# Patient Record
Sex: Male | Born: 1980
Health system: Southern US, Community
[De-identification: ages and names within clinical notes are randomized; demographics above are authoritative.]

## PROBLEM LIST (undated history)

## (undated) DIAGNOSIS — N5089 Other specified disorders of the male genital organs: Secondary | ICD-10-CM

## (undated) DIAGNOSIS — C6290 Malignant neoplasm of unspecified testis, unspecified whether descended or undescended: Secondary | ICD-10-CM

## (undated) DIAGNOSIS — B019 Varicella without complication: Secondary | ICD-10-CM

## (undated) DIAGNOSIS — K219 Gastro-esophageal reflux disease without esophagitis: Secondary | ICD-10-CM

## (undated) HISTORY — DX: Gastro-esophageal reflux disease without esophagitis: K21.9

## (undated) HISTORY — DX: Malignant neoplasm of unspecified testis, unspecified whether descended or undescended: C62.90

## (undated) HISTORY — DX: Varicella without complication: B01.9

---

## 2016-11-20 ENCOUNTER — Other Ambulatory Visit: Payer: Self-pay | Admitting: Urology

## 2016-11-21 ENCOUNTER — Encounter (HOSPITAL_BASED_OUTPATIENT_CLINIC_OR_DEPARTMENT_OTHER): Payer: Self-pay | Admitting: *Deleted

## 2016-11-21 NOTE — Progress Notes (Signed)
SPOKE W/ PT WIFE.  PT VERY ANXIOUS ABOUT DX.  NPO AFTER MN.  ARRIVE AT 8325.  NEEDS HG. NOTE , IT'S PT'S BIRTHDAY DOS.

## 2016-11-24 ENCOUNTER — Ambulatory Visit (HOSPITAL_BASED_OUTPATIENT_CLINIC_OR_DEPARTMENT_OTHER): Payer: Managed Care, Other (non HMO) | Admitting: Anesthesiology

## 2016-11-24 ENCOUNTER — Encounter (HOSPITAL_BASED_OUTPATIENT_CLINIC_OR_DEPARTMENT_OTHER)
Admission: RE | Disposition: A | Discharge status: Home / Self Care | Payer: Self-pay | Source: Ambulatory Visit | Attending: Urology

## 2016-11-24 ENCOUNTER — Encounter (HOSPITAL_BASED_OUTPATIENT_CLINIC_OR_DEPARTMENT_OTHER): Payer: Self-pay | Admitting: *Deleted

## 2016-11-24 ENCOUNTER — Ambulatory Visit (HOSPITAL_BASED_OUTPATIENT_CLINIC_OR_DEPARTMENT_OTHER)
Admission: RE | Admit: 2016-11-24 | Discharge: 2016-11-24 | Disposition: A | Discharge status: Home / Self Care | Payer: Managed Care, Other (non HMO) | Source: Ambulatory Visit | Attending: Urology | Admitting: Urology

## 2016-11-24 DIAGNOSIS — Z302 Encounter for sterilization: Secondary | ICD-10-CM | POA: Insufficient documentation

## 2016-11-24 DIAGNOSIS — N5089 Other specified disorders of the male genital organs: Secondary | ICD-10-CM | POA: Diagnosis present

## 2016-11-24 DIAGNOSIS — Z880 Allergy status to penicillin: Secondary | ICD-10-CM | POA: Diagnosis not present

## 2016-11-24 DIAGNOSIS — C6211 Malignant neoplasm of descended right testis: Secondary | ICD-10-CM | POA: Insufficient documentation

## 2016-11-24 HISTORY — DX: Other specified disorders of the male genital organs: N50.89

## 2016-11-24 HISTORY — PX: ORCHIECTOMY: SHX2116

## 2016-11-24 HISTORY — PX: VASECTOMY: SHX75

## 2016-11-24 LAB — POCT HEMOGLOBIN-HEMACUE: HEMOGLOBIN: 14.7 g/dL (ref 13.0–17.0)

## 2016-11-24 SURGERY — ORCHIECTOMY
Anesthesia: General | Laterality: Right

## 2016-11-24 MED ORDER — PROPOFOL 10 MG/ML IV BOLUS
INTRAVENOUS | Status: AC
  Filled 2016-11-24: qty 40

## 2016-11-24 MED ORDER — CEFAZOLIN SODIUM-DEXTROSE 2-4 GM/100ML-% IV SOLN
2.0000 g | INTRAVENOUS | Status: DC
  Filled 2016-11-24: qty 100

## 2016-11-24 MED ORDER — MIDAZOLAM HCL 5 MG/5ML IJ SOLN
INTRAMUSCULAR | Status: DC | PRN
  Administered 2016-11-24: 2 mg via INTRAVENOUS
  Administered 2016-11-24 (×2): 1 mg via INTRAVENOUS

## 2016-11-24 MED ORDER — BUPIVACAINE HCL (PF) 0.25 % IJ SOLN
INTRAMUSCULAR | Status: DC | PRN
  Administered 2016-11-24: 17 mL

## 2016-11-24 MED ORDER — HYDROMORPHONE HCL 1 MG/ML IJ SOLN
0.2500 mg | INTRAMUSCULAR | Status: DC | PRN
  Filled 2016-11-24: qty 0.5

## 2016-11-24 MED ORDER — CIPROFLOXACIN IN D5W 400 MG/200ML IV SOLN
INTRAVENOUS | Status: DC | PRN
  Administered 2016-11-24: 400 mg via INTRAVENOUS

## 2016-11-24 MED ORDER — ONDANSETRON HCL 4 MG/2ML IJ SOLN
INTRAMUSCULAR | Status: AC
  Filled 2016-11-24: qty 2

## 2016-11-24 MED ORDER — DEXAMETHASONE SODIUM PHOSPHATE 4 MG/ML IJ SOLN
INTRAMUSCULAR | Status: DC | PRN
  Administered 2016-11-24: 10 mg via INTRAVENOUS

## 2016-11-24 MED ORDER — LIDOCAINE HCL (CARDIAC) 20 MG/ML IV SOLN
INTRAVENOUS | Status: DC | PRN
  Administered 2016-11-24: 100 mg via INTRAVENOUS

## 2016-11-24 MED ORDER — DEXAMETHASONE SODIUM PHOSPHATE 10 MG/ML IJ SOLN
INTRAMUSCULAR | Status: AC
  Filled 2016-11-24: qty 1

## 2016-11-24 MED ORDER — ONDANSETRON HCL 4 MG/2ML IJ SOLN
INTRAMUSCULAR | Status: DC | PRN
  Administered 2016-11-24: 4 mg via INTRAVENOUS

## 2016-11-24 MED ORDER — SODIUM CHLORIDE 0.9 % IV SOLN
250.0000 mL | INTRAVENOUS | Status: DC | PRN
  Filled 2016-11-24: qty 250

## 2016-11-24 MED ORDER — MIDAZOLAM HCL 2 MG/2ML IJ SOLN
INTRAMUSCULAR | Status: AC
  Filled 2016-11-24: qty 4

## 2016-11-24 MED ORDER — FENTANYL CITRATE (PF) 100 MCG/2ML IJ SOLN
INTRAMUSCULAR | Status: DC | PRN
  Administered 2016-11-24: 25 ug via INTRAVENOUS
  Administered 2016-11-24: 50 ug via INTRAVENOUS
  Administered 2016-11-24: 25 ug via INTRAVENOUS
  Administered 2016-11-24: 50 ug via INTRAVENOUS
  Administered 2016-11-24 (×2): 25 ug via INTRAVENOUS

## 2016-11-24 MED ORDER — MEPERIDINE HCL 25 MG/ML IJ SOLN
6.2500 mg | INTRAMUSCULAR | Status: DC | PRN
  Filled 2016-11-24: qty 1

## 2016-11-24 MED ORDER — FENTANYL CITRATE (PF) 100 MCG/2ML IJ SOLN
INTRAMUSCULAR | Status: AC
  Filled 2016-11-24: qty 2

## 2016-11-24 MED ORDER — ACETAMINOPHEN 325 MG PO TABS
650.0000 mg | ORAL_TABLET | ORAL | Status: DC | PRN
  Filled 2016-11-24: qty 2

## 2016-11-24 MED ORDER — ACETAMINOPHEN 650 MG RE SUPP
650.0000 mg | RECTAL | Status: DC | PRN
  Filled 2016-11-24: qty 1

## 2016-11-24 MED ORDER — METOCLOPRAMIDE HCL 5 MG/ML IJ SOLN
INTRAMUSCULAR | Status: AC
  Filled 2016-11-24: qty 2

## 2016-11-24 MED ORDER — LIDOCAINE 2% (20 MG/ML) 5 ML SYRINGE
INTRAMUSCULAR | Status: AC
  Filled 2016-11-24: qty 5

## 2016-11-24 MED ORDER — LACTATED RINGERS IV SOLN
INTRAVENOUS | Status: DC
  Administered 2016-11-24 (×2): via INTRAVENOUS
  Filled 2016-11-24: qty 1000

## 2016-11-24 MED ORDER — PROPOFOL 10 MG/ML IV BOLUS
INTRAVENOUS | Status: DC | PRN
  Administered 2016-11-24: 200 mg via INTRAVENOUS
  Administered 2016-11-24: 50 mg via INTRAVENOUS

## 2016-11-24 MED ORDER — TRAMADOL HCL 50 MG PO TABS: 50.0000 mg | ORAL_TABLET | Freq: Four times a day (QID) | ORAL | 0 refills | Status: DC | PRN

## 2016-11-24 MED ORDER — SODIUM CHLORIDE 0.9% FLUSH
3.0000 mL | Freq: Two times a day (BID) | INTRAVENOUS | Status: DC
  Filled 2016-11-24: qty 3

## 2016-11-24 MED ORDER — OXYCODONE HCL 5 MG PO TABS
5.0000 mg | ORAL_TABLET | ORAL | Status: DC | PRN
  Filled 2016-11-24: qty 2

## 2016-11-24 MED ORDER — CIPROFLOXACIN IN D5W 400 MG/200ML IV SOLN
INTRAVENOUS | Status: AC
  Filled 2016-11-24: qty 200

## 2016-11-24 MED ORDER — KETOROLAC TROMETHAMINE 30 MG/ML IJ SOLN
30.0000 mg | Freq: Four times a day (QID) | INTRAMUSCULAR | Status: DC
  Filled 2016-11-24: qty 1

## 2016-11-24 MED ORDER — SODIUM CHLORIDE 0.9% FLUSH
3.0000 mL | INTRAVENOUS | Status: DC | PRN
  Filled 2016-11-24: qty 3

## 2016-11-24 MED ORDER — KETOROLAC TROMETHAMINE 30 MG/ML IJ SOLN
INTRAMUSCULAR | Status: DC | PRN
  Administered 2016-11-24: 30 mg via INTRAVENOUS

## 2016-11-24 MED ORDER — KETOROLAC TROMETHAMINE 30 MG/ML IJ SOLN
INTRAMUSCULAR | Status: AC
  Filled 2016-11-24: qty 1

## 2016-11-24 MED ORDER — METOCLOPRAMIDE HCL 5 MG/ML IJ SOLN
INTRAMUSCULAR | Status: DC | PRN
  Administered 2016-11-24: 10 mg via INTRAVENOUS

## 2016-11-24 MED ORDER — ONDANSETRON HCL 4 MG/2ML IJ SOLN
4.0000 mg | Freq: Once | INTRAMUSCULAR | Status: DC | PRN
Start: 2016-11-24 — End: 2016-11-24
  Filled 2016-11-24: qty 2

## 2016-11-24 SURGICAL SUPPLY — 45 items
BLADE CLIPPER SURG (BLADE) ×4 IMPLANT
BLADE SURG 15 STRL LF DISP TIS (BLADE) ×2 IMPLANT
BLADE SURG 15 STRL SS (BLADE) ×2
BNDG GAUZE ELAST 4 BULKY (GAUZE/BANDAGES/DRESSINGS) ×4 IMPLANT
CLOTH BEACON ORANGE TIMEOUT ST (SAFETY) ×4 IMPLANT
COVER BACK TABLE 60X90IN (DRAPES) ×4 IMPLANT
COVER MAYO STAND STRL (DRAPES) ×4 IMPLANT
DERMABOND ADVANCED (GAUZE/BANDAGES/DRESSINGS) ×2
DERMABOND ADVANCED .7 DNX12 (GAUZE/BANDAGES/DRESSINGS) ×2 IMPLANT
DISSECTOR ROUND CHERRY 3/8 STR (MISCELLANEOUS) IMPLANT
DRAPE LAPAROTOMY 100X72 PEDS (DRAPES) ×4 IMPLANT
DRSG TEGADERM 2-3/8X2-3/4 SM (GAUZE/BANDAGES/DRESSINGS) IMPLANT
ELECT NEEDLE TIP 2.8 STRL (NEEDLE) ×4 IMPLANT
ELECT REM PT RETURN 9FT ADLT (ELECTROSURGICAL) ×4
ELECTRODE REM PT RTRN 9FT ADLT (ELECTROSURGICAL) ×2 IMPLANT
GAUZE SPONGE 4X4 16PLY XRAY LF (GAUZE/BANDAGES/DRESSINGS) IMPLANT
GLOVE BIO SURGEON STRL SZ8 (GLOVE) ×4 IMPLANT
GOWN STRL REUS W/ TWL XL LVL3 (GOWN DISPOSABLE) ×2 IMPLANT
GOWN STRL REUS W/TWL XL LVL3 (GOWN DISPOSABLE) ×6 IMPLANT
GOWN W/2 COTTON TOWELS 2 STD (GOWNS) ×4 IMPLANT
KIT RM TURNOVER CYSTO AR (KITS) ×4 IMPLANT
MANIFOLD NEPTUNE II (INSTRUMENTS) IMPLANT
NEEDLE HYPO 25X1 1.5 SAFETY (NEEDLE) ×4 IMPLANT
NEEDLE HYPO 25X5/8 SAFETYGLIDE (NEEDLE) ×4 IMPLANT
NS IRRIG 500ML POUR BTL (IV SOLUTION) ×4 IMPLANT
PACK BASIN DAY SURGERY FS (CUSTOM PROCEDURE TRAY) ×4 IMPLANT
PENCIL BUTTON HOLSTER BLD 10FT (ELECTRODE) ×4 IMPLANT
SUPPORT SCROTAL LG STRP (MISCELLANEOUS) ×3 IMPLANT
SUPPORTER ATHLETIC LG (MISCELLANEOUS) ×1
SUT CHROMIC 3 0 SH 27 (SUTURE) ×4 IMPLANT
SUT CHROMIC 4 0 SH 27 (SUTURE) ×4 IMPLANT
SUT MNCRL AB 4-0 PS2 18 (SUTURE) ×4 IMPLANT
SUT SILK 0 TIES 10X30 (SUTURE) ×4 IMPLANT
SUT VIC AB 3-0 SH 27 (SUTURE) ×2
SUT VIC AB 3-0 SH 27X BRD (SUTURE) ×2 IMPLANT
SUT VICRYL 2 0 18  UND BR (SUTURE)
SUT VICRYL 2 0 18 UND BR (SUTURE) IMPLANT
SYR BULB IRRIGATION 50ML (SYRINGE) IMPLANT
SYR CONTROL 10ML LL (SYRINGE) ×4 IMPLANT
TOWEL OR 17X24 6PK STRL BLUE (TOWEL DISPOSABLE) ×8 IMPLANT
TRAY DSU PREP LF (CUSTOM PROCEDURE TRAY) ×4 IMPLANT
TUBE CONNECTING 12'X1/4 (SUCTIONS) ×1
TUBE CONNECTING 12X1/4 (SUCTIONS) ×3 IMPLANT
WATER STERILE IRR 500ML POUR (IV SOLUTION) ×4 IMPLANT
YANKAUER SUCT BULB TIP NO VENT (SUCTIONS) ×4 IMPLANT

## 2016-11-24 NOTE — Anesthesia Preprocedure Evaluation (Signed)
Anesthesia Evaluation  Patient identified by MRN, date of birth, ID band Patient awake    Reviewed: Allergy & Precautions, NPO status , Patient's Chart, lab work & pertinent test results  Airway Mallampati: I  TM Distance: >3 FB Neck ROM: Full    Dental   Pulmonary    Pulmonary exam normal        Cardiovascular Normal cardiovascular exam     Neuro/Psych    GI/Hepatic   Endo/Other    Renal/GU      Musculoskeletal   Abdominal   Peds  Hematology   Anesthesia Other Findings   Reproductive/Obstetrics                             Anesthesia Physical Anesthesia Plan  ASA: II  Anesthesia Plan: General   Post-op Pain Management:    Induction: Intravenous  Airway Management Planned: LMA  Additional Equipment:   Intra-op Plan:   Post-operative Plan: Extubation in OR  Informed Consent: I have reviewed the patients History and Physical, chart, labs and discussed the procedure including the risks, benefits and alternatives for the proposed anesthesia with the patient or authorized representative who has indicated his/her understanding and acceptance.     Plan Discussed with: CRNA and Surgeon  Anesthesia Plan Comments:         Anesthesia Quick Evaluation

## 2016-11-24 NOTE — Anesthesia Postprocedure Evaluation (Signed)
Anesthesia Post Note  Patient: Kvon Mcilhenny  Procedure(s) Performed: Procedure(s) (LRB): ORCHIECTOMY (Right) VASECTOMY (Left)  Patient location during evaluation: PACU Anesthesia Type: General Level of consciousness: awake and alert Pain management: pain level controlled Vital Signs Assessment: post-procedure vital signs reviewed and stable Respiratory status: spontaneous breathing, nonlabored ventilation, respiratory function stable and patient connected to nasal cannula oxygen Cardiovascular status: blood pressure returned to baseline and stable Postop Assessment: no signs of nausea or vomiting Anesthetic complications: no       Last Vitals:  Vitals:   11/24/16 1315 11/24/16 1400  BP: 115/72 (!) 107/56  Pulse: (!) 45 (!) 45  Resp: 12   Temp:  36.6 C    Last Pain:  Vitals:   11/24/16 1400  TempSrc:   PainSc: 0-No pain                 Mileigh Tilley DAVID

## 2016-11-24 NOTE — H&P (Signed)
  Urology History and Physical Exam  CC: Right testicular mass  HPI: 36 year old male presents for rt inguinal orchiectomy as well as a left vasectomy. He presented recently for his vasectomy but was noted to have a rt testicular mass. U/S confirmed a mixed echogenic mass throughout his rt testicle. Serum markers were essentially negative. He presents for rt inguinal orchiectomy and lt vasectomy.  PMH: Past Medical History:  Diagnosis Date  . Mass of right testis     PSH: Past Surgical History:  Procedure Laterality Date  . NO PAST SURGERIES      Allergies: Allergies  Allergen Reactions  . Penicillins Rash    Childhood allergy    Medications: No prescriptions prior to admission.     Social History: Social History   Social History  . Marital status: Married    Spouse name: N/A  . Number of children: N/A  . Years of education: N/A   Occupational History  . Not on file.   Social History Main Topics  . Smoking status: Never Smoker  . Smokeless tobacco: Former Systems developer    Types: Chew    Quit date: 11/21/2001  . Alcohol use Yes     Comment: social  . Drug use: No  . Sexual activity: Not on file   Other Topics Concern  . Not on file   Social History Narrative  . No narrative on file    Family History: History reviewed. No pertinent family history.  Review of Systems: Positive: Rt testicular swelling Negative: A further 10 point review of systems was negative except what is listed in the HPI.                  Physical Exam: @VITALS2 @ General: No acute distress.  Awake. Head:  Normocephalic.  Atraumatic. ENT:  EOMI.  Mucous membranes moist Neck:  Supple.  No lymphadenopathy. CV:  S1 present. S2 present. Regular rate. Pulmonary: Equal effort bilaterally.  Clear to auscultation bilaterally. Abdomen: Soft.  Non tender to palpation. Skin:  Normal turgor.  No visible rash. Extremity: No gross deformity of bilateral upper extremities.  No gross deformity of                              lower extremities. Neurologic: Alert. Appropriate mood.  Penis:             circumcised.  No lesions. Urethra: Orthotopic meatus. Scrotum: No lesions.  No ecchymosis.  No erythema. Testicles: Descended bilaterally.  Rt testicular mass. Epididymis: Palpable bilaterally. Nontender to palpation.  Studies:  No results for input(s): HGB, WBC, PLT in the last 72 hours.  No results for input(s): NA, K, CL, CO2, BUN, CREATININE, CALCIUM, GFRNONAA, GFRAA in the last 72 hours.  Invalid input(s): MAGNESIUM   No results for input(s): INR, APTT in the last 72 hours.  Invalid input(s): PT   Invalid input(s): ABG    Assessment:  Rt testicular mass, desire for vasectomy  Plan: Lt vasectomy, rt inguinal orchiectomy

## 2016-11-24 NOTE — Discharge Instructions (Signed)
°  Post Anesthesia Home Care Instructions ° °Activity: °Get plenty of rest for the remainder of the day. A responsible individual must stay with you for 24 hours following the procedure.  °For the next 24 hours, DO NOT: °-Drive a car °-Operate machinery °-Drink alcoholic beverages °-Take any medication unless instructed by your physician °-Make any legal decisions or sign important papers. ° °Meals: °Start with liquid foods such as gelatin or soup. Progress to regular foods as tolerated. Avoid greasy, spicy, heavy foods. If nausea and/or vomiting occur, drink only clear liquids until the nausea and/or vomiting subsides. Call your physician if vomiting continues. ° °Special Instructions/Symptoms: °Your throat may feel dry or sore from the anesthesia or the breathing tube placed in your throat during surgery. If this causes discomfort, gargle with warm salt water. The discomfort should disappear within 24 hours. ° °If you had a scopolamine patch placed behind your ear for the management of post- operative nausea and/or vomiting: ° °1. The medication in the patch is effective for 72 hours, after which it should be removed.  Wrap patch in a tissue and discard in the trash. Wash hands thoroughly with soap and water. °2. You may remove the patch earlier than 72 hours if you experience unpleasant side effects which may include dry mouth, dizziness or visual disturbances. °3. Avoid touching the patch. Wash your hands with soap and water after contact with the patch. °  ° HOME CARE INSTRUCTIONS FOR SCROTAL PROCEDURES ° °Wound Care & Hygiene: °You may apply an ice bag to the scrotum for the first 24 hours.  This may help decrease swelling and soreness.  You may have a dressing held in place by an athletic supporter.  You may remove the dressing in 24 hours and shower in 48 hours.  Continue to use the athletic supporter or tight briefs for at least a week. °Activity: °Rest today - not necessarily flat bed rest.  Just take it  easy.  You should not do strenuous activities until your follow-up visit with your doctor.  You may resume light activity in 48 hours. ° °Return to Work: ° °Your doctor will advise you of this depending on the type of work you do ° °Diet: °Drink liquids or eat a light diet this evening.  You may resume a regular diet tomorrow. ° °General Expectations: °You may have a small amount of bleeding.  The scrotum may be swollen or bruised for about a week. ° °Call your Doctor if these occur: ° -persistent or heavy bleeding ° -temperature of 101 degrees or more ° -severe pain, not relieved by your pain medication ° ° ° °

## 2016-11-24 NOTE — Op Note (Signed)
Preoperative diagnosis: Right testicular mass, probable testicular carcinoma, desire for sterility  Postoperative diagnosis: Same  Principal procedure: Right inguinal orchiectomy, left vasectomy  Surgeon: Kapil Petropoulos  Anesthesia: General with LMA, right spermatic cord block, Marcaine in the left vasal sheath  Complications: None  Estimated blood loss: Less than 5 cc  Specimen: Right testicle and cord, specimen of left vasa  Drains: None  Indications: 36 year old male recently presenting for vasectomy in the office.  He was found to have a right testicular mass.  This was verified with scrotal ultrasound.  There is a strong suspicion for testicular carcinoma.  The patient presents at this time for right inguinal orchiectomy as well as a left vasectomy.  He has been counseled on the surgery of orchiectomy, as well as risks and complications.  He previously has been counseled regarding vasectomy.  He accepts both of these and agrees to proceed.  Description of procedure: The patient was properly identified and marked in the holding area.  He received IV Cipro.  He was taken to the operating room where general anesthetic was administered with the LMA.  He was placed in dorsolithotomy position.  Genitalia and perineum as well as the inguinal region were prepped and draped.  Proper timeout was performed.  A 2-1/2 cm incision was created directly over the right external inguinal ring and carried down to subcutaneous fat with electrocautery.  Dissection was then carried out bluntly with the [employer] retractors.  The external inguinal ring and the spermatic cord were easily identified.  The spermatic cord was carefully dissected out, leaving the ilioinguinal nerve intact laterally.  The cord was then retracted superiorly, and the right testicle pushed from the scrotum up into the incision.  The testicle was delivered through the incision.  Gubernaculum was then divided with blunt dissection and with  electrocautery.  Dissection was then carried out superiorly on the cord to the external inguinal ring.  Cord was skeletonized.  The cord was then divided into 2 packets, with each packet clamped with a Kelly clamp approximately 1 cm distal to the external inguinal ring.  The spermatic cord was then divided just distal to the clamps.  Approximately 5-6 cc of quarter percent plain Marcaine was then used to establish a spermatic cord block.  Each cord packet was then ligated doubly with #1 silk ties.  Hemostasis was adequate.  The cord stump was then pushed up inside the external inguinal ring. Attention was then paid to the incision.  Subcutaneous tissue was hemostatic.  The scrotum was inverted, and the inner surface was examined.  Hemostasis was excellent. I then placed 5 cc of Marcaine in the incision along the skin edges.  Subcutaneous tissue was reapproximated using a running simple suture of 3-0 Vicryl.  Skin edges were then reapproximated using 4-0 Monocryl placed in a running subcuticular fashion.  Dermabond was then placed on the wound.  Attention was then paid to the left vas deferens, which was transfixedin the anterior scrotum underneath the median raphae with the vasectomy clamp. Proximal and distal extent of the vas deferens underneath was then infiltrated with 5 cc of quarter percent plain Marcaine.  Puncture was then made over top of the vas deferens with the vasectomy hemostat, and this small puncture was then widened.  The vas deferens was then grasped inside the puncture site, and brought through the wound.  The vas was then skeletonized proximally and distally for approximately 2 cm.  It was then clamped proximally and distally, and a segment  excised.  The stumps were then ligated with 2-0 Vicryl.  The ends were cauterized.  The ends were then folded and buried in dartos fascia separately with 4-0 Vicryl.  They were then delivered back into the wound.  The wound was then covered with  Dermabond.  Dry sterile dressings were then placed underneath a jockstrap.  The patient was then awakened, and taken to the PACU in stable condition.  He tolerated the procedure well.

## 2016-11-24 NOTE — Anesthesia Procedure Notes (Signed)
Procedure Name: LMA Insertion Date/Time: 11/24/2016 11:50 AM Performed by: Justice Rocher Pre-anesthesia Checklist: Patient identified, Emergency Drugs available, Suction available and Patient being monitored Patient Re-evaluated:Patient Re-evaluated prior to inductionOxygen Delivery Method: Circle system utilized Preoxygenation: Pre-oxygenation with 100% oxygen Intubation Type: IV induction Ventilation: Mask ventilation without difficulty LMA: LMA inserted LMA Size: 5.0 Number of attempts: 1 Airway Equipment and Method: Bite block Placement Confirmation: positive ETCO2 and breath sounds checked- equal and bilateral Tube secured with: Tape Dental Injury: Teeth and Oropharynx as per pre-operative assessment

## 2016-11-24 NOTE — Transfer of Care (Signed)
Immediate Anesthesia Transfer of Care Note  Patient: Martin Dunn  Procedure(s) Performed: Procedure(s) (LRB): ORCHIECTOMY (Right) VASECTOMY (Left)  Patient Location: PACU  Anesthesia Type: General  Level of Consciousness: awake, sedated, patient cooperative and responds to stimulation  Airway & Oxygen Therapy: Patient Spontanous Breathing and Patient connected to [place] O2  Post-op Assessment: Report given to PACU RN, Post -op Vital signs reviewed and stable and Patient moving all extremities  Post vital signs: Reviewed and stable  Complications: No apparent anesthesia complications

## 2016-11-27 ENCOUNTER — Encounter (HOSPITAL_BASED_OUTPATIENT_CLINIC_OR_DEPARTMENT_OTHER): Payer: Self-pay | Admitting: Urology

## 2016-12-08 ENCOUNTER — Ambulatory Visit (HOSPITAL_COMMUNITY)
Admission: RE | Admit: 2016-12-08 | Discharge: 2016-12-08 | Disposition: A | Discharge status: Home / Self Care | Payer: Managed Care, Other (non HMO) | Source: Ambulatory Visit | Attending: Urology | Admitting: Urology

## 2016-12-08 ENCOUNTER — Other Ambulatory Visit: Payer: Self-pay | Admitting: Urology

## 2016-12-08 DIAGNOSIS — C6210 Malignant neoplasm of unspecified descended testis: Secondary | ICD-10-CM | POA: Diagnosis not present

## 2017-03-08 ENCOUNTER — Other Ambulatory Visit: Payer: Self-pay | Admitting: Urology

## 2017-03-08 ENCOUNTER — Ambulatory Visit (HOSPITAL_COMMUNITY)
Admission: RE | Admit: 2017-03-08 | Discharge: 2017-03-08 | Disposition: A | Discharge status: Home / Self Care | Payer: 59 | Source: Ambulatory Visit | Attending: Urology | Admitting: Urology

## 2017-03-08 DIAGNOSIS — C6210 Malignant neoplasm of unspecified descended testis: Secondary | ICD-10-CM

## 2017-06-13 ENCOUNTER — Other Ambulatory Visit: Payer: Self-pay | Admitting: Urology

## 2017-06-13 ENCOUNTER — Ambulatory Visit (HOSPITAL_COMMUNITY)
Admission: RE | Admit: 2017-06-13 | Discharge: 2017-06-13 | Disposition: A | Discharge status: Home / Self Care | Payer: 59 | Source: Ambulatory Visit | Attending: Urology | Admitting: Urology

## 2017-06-13 DIAGNOSIS — C6210 Malignant neoplasm of unspecified descended testis: Secondary | ICD-10-CM

## 2017-09-14 DIAGNOSIS — B351 Tinea unguium: Secondary | ICD-10-CM | POA: Diagnosis not present

## 2017-10-30 DIAGNOSIS — J310 Chronic rhinitis: Secondary | ICD-10-CM | POA: Diagnosis not present

## 2017-11-16 DIAGNOSIS — J1189 Influenza due to unidentified influenza virus with other manifestations: Secondary | ICD-10-CM | POA: Diagnosis not present

## 2017-11-30 DIAGNOSIS — Z8547 Personal history of malignant neoplasm of testis: Secondary | ICD-10-CM | POA: Diagnosis not present

## 2017-12-07 ENCOUNTER — Ambulatory Visit (HOSPITAL_COMMUNITY)
Admission: RE | Admit: 2017-12-07 | Discharge: 2017-12-07 | Disposition: A | Discharge status: Home / Self Care | Payer: 59 | Source: Ambulatory Visit | Attending: Urology | Admitting: Urology

## 2017-12-07 ENCOUNTER — Other Ambulatory Visit: Payer: Self-pay | Admitting: Urology

## 2017-12-07 DIAGNOSIS — C6210 Malignant neoplasm of unspecified descended testis: Secondary | ICD-10-CM | POA: Diagnosis present

## 2017-12-07 DIAGNOSIS — Z8547 Personal history of malignant neoplasm of testis: Secondary | ICD-10-CM | POA: Diagnosis not present

## 2018-03-14 DIAGNOSIS — B351 Tinea unguium: Secondary | ICD-10-CM | POA: Diagnosis not present

## 2018-04-26 ENCOUNTER — Ambulatory Visit (HOSPITAL_COMMUNITY)
Admission: RE | Admit: 2018-04-26 | Discharge: 2018-04-26 | Disposition: A | Discharge status: Home / Self Care | Payer: 59 | Source: Ambulatory Visit | Attending: Urology | Admitting: Urology

## 2018-04-26 ENCOUNTER — Other Ambulatory Visit: Payer: Self-pay | Admitting: Urology

## 2018-04-26 DIAGNOSIS — C6290 Malignant neoplasm of unspecified testis, unspecified whether descended or undescended: Secondary | ICD-10-CM | POA: Diagnosis not present

## 2018-04-26 DIAGNOSIS — C6210 Malignant neoplasm of unspecified descended testis: Secondary | ICD-10-CM

## 2018-06-23 ENCOUNTER — Emergency Department
Admission: EM | Admit: 2018-06-23 | Discharge: 2018-06-24 | Disposition: A | Discharge status: Home / Self Care | Payer: 59 | Attending: Emergency Medicine | Admitting: Emergency Medicine

## 2018-06-23 ENCOUNTER — Other Ambulatory Visit: Payer: Self-pay

## 2018-06-23 ENCOUNTER — Encounter: Payer: Self-pay | Admitting: Emergency Medicine

## 2018-06-23 DIAGNOSIS — J020 Streptococcal pharyngitis: Secondary | ICD-10-CM | POA: Diagnosis not present

## 2018-06-23 DIAGNOSIS — J029 Acute pharyngitis, unspecified: Secondary | ICD-10-CM | POA: Diagnosis not present

## 2018-06-23 DIAGNOSIS — Z79899 Other long term (current) drug therapy: Secondary | ICD-10-CM | POA: Insufficient documentation

## 2018-06-23 LAB — GROUP A STREP BY PCR: Group A Strep by PCR: DETECTED — AB

## 2018-06-23 MED ORDER — DEXAMETHASONE SODIUM PHOSPHATE 10 MG/ML IJ SOLN
10.0000 mg | Freq: Once | INTRAMUSCULAR | Status: AC
  Administered 2018-06-23: 10 mg via INTRAMUSCULAR

## 2018-06-23 MED ORDER — AZITHROMYCIN 250 MG PO TABS: 250.0000 mg | ORAL_TABLET | Freq: Every day | ORAL | 0 refills | Status: AC

## 2018-06-23 MED ORDER — AZITHROMYCIN 500 MG PO TABS
500.0000 mg | ORAL_TABLET | Freq: Once | ORAL | Status: AC
  Administered 2018-06-24: 500 mg via ORAL
  Filled 2018-06-23: qty 1

## 2018-06-23 MED ORDER — DEXAMETHASONE SODIUM PHOSPHATE 10 MG/ML IJ SOLN
INTRAMUSCULAR | Status: AC
  Filled 2018-06-23: qty 1

## 2018-06-23 NOTE — ED Notes (Signed)
Report to tony, rn

## 2018-06-23 NOTE — ED Provider Notes (Signed)
[facility] Emergency Department Provider Note ____________________________________________  Time seen: 2147  I have reviewed the triage vital signs and the nursing notes.  HISTORY  Chief Complaint  Sore Throat  HPI Martin Dunn is a 37 y.o. male who presents to the ED with a 2-day complaint of severe sore throat and high fevers.  Patient describes been taking Tylenol and ibuprofen every 4 hours today and took his last dose about 1800.  He reports a T-max of 58 F at that time.  He denies any known sick contacts or other exposures.  Patient denies any nausea, vomiting, or dizziness.  He does report some generalized malaise and general body aches.  Past Medical History:  Diagnosis Date  . Mass of right testis     There are no active problems to display for this patient.   Past Surgical History:  Procedure Laterality Date  . NO PAST SURGERIES    . ORCHIECTOMY Right 11/24/2016   Procedure: ORCHIECTOMY;  Surgeon: Franchot Gallo, MD;  [facility];  Service: Urology;  Laterality: Right;  ONLY NEED 45 MINUTES TOTAL FOR BOTH PROCEDURES  . VASECTOMY Left 11/24/2016   Procedure: VASECTOMY;  Surgeon: Franchot Gallo, MD;  Location: [facility];  Service: Urology;  Laterality: Left;    Prior to Admission medications   Medication Sig Start Date End Date Taking? Authorizing Provider  azithromycin (ZITHROMAX Z-PAK) 250 MG tablet Take 1 tablet (250 mg total) by mouth daily for 4 days. 06/23/18 06/27/18  Philo Kurtz, Dannielle Karvonen, PA-C  Multiple Vitamins-Minerals (MENS MULTIVITAMIN PLUS PO) Take by mouth daily.    [provider]  traMADol (ULTRAM) 50 MG tablet Take 1 tablet (50 mg total) by mouth every 6 (six) hours as needed. 11/24/16   Franchot Gallo, MD  vitamin C (ASCORBIC ACID) 500 MG tablet Take 500 mg by mouth daily.    [provider]    Allergies Penicillins  No family history on file.  Social  History Social History   Tobacco Use  . Smoking status: Never Smoker  . Smokeless tobacco: Former Systems developer    Types: Chew  Substance Use Topics  . Alcohol use: Yes    Comment: social  . Drug use: No    Review of Systems  Constitutional: Positive for fever. Eyes: Negative for visual changes. ENT: Positive for sore throat. Cardiovascular: Negative for chest pain. Respiratory: Negative for shortness of breath. Gastrointestinal: Negative for abdominal pain, vomiting and diarrhea. Genitourinary: Negative for dysuria. Musculoskeletal: Negative for back pain. Skin: Negative for rash. Neurological: Negative for headaches, focal weakness or numbness. ____________________________________________  PHYSICAL EXAM:  VITAL SIGNS: ED Triage Vitals  Enc Vitals Group     BP 06/23/18 2054 125/77     Pulse Rate 06/23/18 2054 100     Resp 06/23/18 2054 18     Temp 06/23/18 2054 99.3 F (37.4 C)     Temp Source 06/23/18 2054 Oral     SpO2 06/23/18 2054 96 %     Weight 06/23/18 2054 250 lb (113.4 kg)     Height 06/23/18 2054 6' (1.829 m)     Head Circumference --      Peak Flow --      Pain Score 06/23/18 2057 1     Pain Loc --      Pain Edu? --      Excl. in [place]? --     Constitutional: Alert and oriented. Well appearing and in no distress. Head:  Normocephalic and atraumatic. Eyes: Conjunctivae are normal. Normal extraocular movements Ears: Canals clear. TMs intact bilaterally. Nose: No congestion/rhinorrhea/epistaxis. Mouth/Throat: Mucous membranes are moist.  Uvula is midline but oropharynx is erythematous with tonsils approaching the midline.  There is erythema and edema appreciated.  No obvious exudates noted. Neck: Supple. No thyromegaly. Hematological/Lymphatic/Immunological: Palpable anterior cervical lymphadenopathy. Cardiovascular: Normal rate, regular rhythm. Normal distal pulses. Respiratory: Normal respiratory effort. No  wheezes/rales/rhonchi. ____________________________________________   LABS (pertinent positives/negatives)  Labs Reviewed  GROUP A STREP BY PCR - Abnormal; Notable for the following components:      Result Value   Group A Strep by PCR DETECTED (*)    All other components within normal limits  ____________________________________________  PROCEDURES  Procedures Decadron 10 mg IM Azithromycin 500 mg PO ____________________________________________  INITIAL IMPRESSION / ASSESSMENT AND PLAN / ED COURSE  Patient with ED evaluation of sudden sore throat and fevers in the last 48 hours.  Patient's clinical picture is concerning for strep pharyngitis versus a viral etiology.  His strep PCR confirms group A strep.  Treated empirically with azithromycin given his remote history of a penicillin allergy.  He will follow-up with his primary provider or return to the ED as needed.  A work note is provided for 1 day as is appropriate. ____________________________________________  FINAL CLINICAL IMPRESSION(S) / ED DIAGNOSES  Final diagnoses:  Strep pharyngitis      Lurene Robley, Dannielle Karvonen, PA-C 06/23/18 2305    Nance Pear, MD 06/23/18 2330

## 2018-06-23 NOTE — Discharge Instructions (Signed)
Your rapid test confirms strep pharyngitis. Take the antibiotic over the next 4 days as directed. Take OTC Tylenol and Motrin for fevers. Change your toothbrush after 24-hours on your antibiotic. Follow-up with your provider as needed.

## 2018-06-23 NOTE — ED Triage Notes (Signed)
Pt arrives POV to triage with c/o sore throat x 2 days. Pt reports alternating tylenol and ibuprofen x 4 hours today and last took ibuprofen x 800 mg at 1800. Pt reports temperature of 103 at the same time. Pt is ambulatory and in NAD.

## 2018-08-16 DIAGNOSIS — J209 Acute bronchitis, unspecified: Secondary | ICD-10-CM | POA: Diagnosis not present

## 2018-09-20 ENCOUNTER — Encounter: Payer: Self-pay | Admitting: Family Medicine

## 2018-09-20 ENCOUNTER — Ambulatory Visit (INDEPENDENT_AMBULATORY_CARE_PROVIDER_SITE_OTHER): Payer: 59 | Admitting: Family Medicine

## 2018-09-20 DIAGNOSIS — Z8679 Personal history of other diseases of the circulatory system: Secondary | ICD-10-CM | POA: Diagnosis not present

## 2018-09-20 DIAGNOSIS — K219 Gastro-esophageal reflux disease without esophagitis: Secondary | ICD-10-CM

## 2018-09-20 DIAGNOSIS — C6291 Malignant neoplasm of right testis, unspecified whether descended or undescended: Secondary | ICD-10-CM

## 2018-09-20 DIAGNOSIS — B009 Herpesviral infection, unspecified: Secondary | ICD-10-CM | POA: Diagnosis not present

## 2018-09-20 DIAGNOSIS — C6290 Malignant neoplasm of unspecified testis, unspecified whether descended or undescended: Secondary | ICD-10-CM | POA: Insufficient documentation

## 2018-09-20 MED ORDER — VALACYCLOVIR HCL 1 G PO TABS: 2000.0000 mg | ORAL_TABLET | Freq: Two times a day (BID) | ORAL | 0 refills | Status: DC

## 2018-09-20 NOTE — Assessment & Plan Note (Signed)
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  He was prescribed Valtrex today to take at first sign of cold sore flare.  If he has a genital issue he will contact us as the directions would change for treatment.

## 2018-09-20 NOTE — Patient Instructions (Signed)
Nice to see you. Please try the Valtrex next time you have a cold sore outbreak.  If is not beneficial please let us know.

## 2018-09-20 NOTE — Assessment & Plan Note (Signed)
Status post orchiectomy.  He will continue to follow with urology.

## 2018-09-20 NOTE — Progress Notes (Signed)
Tommi Rumps, MD Phone: [phone]  Martin Dunn is a 38 y.o. male who presents today for new patient visit.  Spermatocytic tumor: Notes removal in 2018.  Followed by urology.  He has had no additional issues from this.  Sees them every 6 months.  No family history of cancer.  GERD: Patient notes only having issues with this once every 3 months.  He will take a 2-week course of omeprazole and symptoms will be good.  He tries to avoid foods that cause this such as acidic foods and red meat.  HSV: Patient reports he has a history of HSV 1 and 2.  Notes he rarely gets genital symptoms.  He will have a cold sore come up once every 3 months or so and would take acyclovir.  He has never tried Valtrex.  He reports being told he has an irregular heartbeat in the past.  He states he was advised that it was a normal arrhythmia.  Notes this was seen on EKG several times per his report.  Active Ambulatory Problems    Diagnosis Date Noted  . GERD (gastroesophageal reflux disease) 09/20/2018  . HSV infection 09/20/2018  . Spermatocytic seminoma ([place]) 09/20/2018  . History of irregular heartbeat 09/20/2018   Resolved Ambulatory Problems    Diagnosis Date Noted  . No Resolved Ambulatory Problems   Past Medical History:  Diagnosis Date  . Chicken pox   . Mass of right testis     Family History  Problem Relation Age of Onset  . Arthritis Mother   . Asthma Mother   . Depression Mother   . Miscarriages / [place] Mother   . Diabetes Father   . Hearing loss Father   . Heart attack Father   . Hypertension Father   . Arthritis Maternal Grandmother   . COPD Maternal Grandfather   . Heart disease Paternal Grandmother     Social History   Socioeconomic History  . Marital status: Married    Spouse name: Not on file  . Number of children: Not on file  . Years of education: Not on file  . Highest education level: Not on file  Occupational History  . Not on file  Social Needs  .  Financial resource strain: Not on file  . Food insecurity:    Worry: Not on file    Inability: Not on file  . Transportation needs:    Medical: Not on file    Non-medical: Not on file  Tobacco Use  . Smoking status: Never Smoker  . Smokeless tobacco: Former Systems developer    Types: Chew  Substance and Sexual Activity  . Alcohol use: Yes    Comment: social  . Drug use: No  . Sexual activity: Yes  Lifestyle  . Physical activity:    Days per week: Not on file    Minutes per session: Not on file  . Stress: Not on file  Relationships  . Social connections:    Talks on phone: Not on file    Gets together: Not on file    Attends religious service: Not on file    Active member of club or organization: Not on file    Attends meetings of clubs or organizations: Not on file    Relationship status: Not on file  . Intimate partner violence:    Fear of current or ex partner: Not on file    Emotionally abused: Not on file    Physically abused: Not on file  Forced sexual activity: Not on file  Other Topics Concern  . Not on file  Social History Narrative  . Not on file    ROS  General:  Negative for nexplained weight loss, fever Skin: Negative for new or changing mole, sore that won't heal HEENT: Negative for trouble hearing, trouble seeing, ringing in ears, mouth sores, hoarseness, change in voice, dysphagia. CV:  Negative for chest pain, dyspnea, edema, palpitations Resp: Negative for cough, dyspnea, hemoptysis GI: Negative for nausea, vomiting, diarrhea, constipation, abdominal pain, melena, hematochezia. GU: Negative for dysuria, incontinence, urinary hesitance, hematuria, vaginal or penile discharge, polyuria, sexual difficulty, lumps in testicle or breasts MSK: Negative for muscle cramps or aches, joint pain or swelling Neuro: Negative for headaches, weakness, numbness, dizziness, passing out/fainting Psych: Negative for depression, anxiety, memory problems  Objective  Physical  Exam Vitals:   09/20/18 1529  BP: 102/64  Pulse: 63  Temp: 97.9 F (36.6 C)  SpO2: 99%    BP Readings from Last 3 Encounters:  09/20/18 102/64  06/23/18 121/74  11/24/16 (!) 107/56   Wt Readings from Last 3 Encounters:  09/20/18 256 lb 9.6 oz (116.4 kg)  06/23/18 250 lb (113.4 kg)  11/24/16 241 lb 8 oz (109.5 kg)    Physical Exam Constitutional:      General: He is not in acute distress.    Appearance: He is not diaphoretic.  HENT:     Head: Normocephalic and atraumatic.  Cardiovascular:     Rate and Rhythm: Normal rate and regular rhythm.     Heart sounds: Normal heart sounds.  Pulmonary:     Effort: Pulmonary effort is normal.     Breath sounds: Normal breath sounds.  Musculoskeletal:     Right lower leg: No edema.     Left lower leg: No edema.  Skin:    General: Skin is warm and dry.  Neurological:     Mental Status: He is alert.  Psychiatric:        Mood and Affect: Mood normal.      Assessment/Plan:   GERD (gastroesophageal reflux disease) Rare symptoms.  He will continue as needed omeprazole.  HSV infection Patient reports history of HSV-1 and HSV-2.  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  He was prescribed Valtrex today to take at first sign of cold sore flare.  If he has a genital issue he will contact us as the directions would change for treatment.  Spermatocytic seminoma ([place]) Status post orchiectomy.  He will continue to follow with urology.  History of irregular heartbeat We will request records from his former primary care physician.  Benign exam today.  Asymptomatic.   No orders of the defined types were placed in this encounter.   Meds ordered this encounter  Medications  . valACYclovir (VALTREX) 1000 MG tablet    Sig: Take 2 tablets (2,000 mg total) by mouth 2 (two) times daily. Take for one day at first sign of symptoms.    Dispense:  20 tablet    Refill:  0     Tommi Rumps, MD [place]

## 2018-09-20 NOTE — Assessment & Plan Note (Signed)
Rare symptoms.  He will continue as needed omeprazole.

## 2018-09-20 NOTE — Assessment & Plan Note (Signed)
We will request records from his former primary care physician.  Benign exam today.  Asymptomatic.

## 2018-12-27 ENCOUNTER — Ambulatory Visit
Admission: RE | Admit: 2018-12-27 | Discharge: 2018-12-27 | Disposition: A | Discharge status: Home / Self Care | Payer: 59 | Source: Ambulatory Visit | Attending: Urology | Admitting: Urology

## 2018-12-27 ENCOUNTER — Other Ambulatory Visit: Payer: Self-pay | Admitting: Urology

## 2018-12-27 ENCOUNTER — Other Ambulatory Visit: Payer: Self-pay

## 2018-12-27 DIAGNOSIS — C6210 Malignant neoplasm of unspecified descended testis: Secondary | ICD-10-CM

## 2018-12-27 DIAGNOSIS — R05 Cough: Secondary | ICD-10-CM | POA: Diagnosis not present

## 2019-07-02 ENCOUNTER — Ambulatory Visit
Admission: RE | Admit: 2019-07-02 | Discharge: 2019-07-02 | Disposition: A | Discharge status: Home / Self Care | Payer: 59 | Source: Ambulatory Visit | Attending: Urology | Admitting: Urology

## 2019-07-02 ENCOUNTER — Other Ambulatory Visit: Payer: Self-pay | Admitting: Urology

## 2019-07-02 ENCOUNTER — Other Ambulatory Visit: Payer: Self-pay

## 2019-07-02 DIAGNOSIS — C6210 Malignant neoplasm of unspecified descended testis: Secondary | ICD-10-CM | POA: Diagnosis present

## 2019-10-03 ENCOUNTER — Encounter: Payer: 59 | Admitting: Family Medicine

## 2019-10-31 ENCOUNTER — Encounter: Payer: 59 | Admitting: Family Medicine

## 2019-11-13 ENCOUNTER — Ambulatory Visit (INDEPENDENT_AMBULATORY_CARE_PROVIDER_SITE_OTHER): Payer: 59 | Admitting: Family Medicine

## 2019-11-13 ENCOUNTER — Encounter: Payer: Self-pay | Admitting: Family Medicine

## 2019-11-13 ENCOUNTER — Other Ambulatory Visit: Payer: Self-pay

## 2019-11-13 VITALS — BP 110/70 | HR 72 | Temp 96.2°F | Ht 72.0 in | Wt 256.2 lb

## 2019-11-13 DIAGNOSIS — K625 Hemorrhage of anus and rectum: Secondary | ICD-10-CM | POA: Diagnosis not present

## 2019-11-13 DIAGNOSIS — Z0001 Encounter for general adult medical examination with abnormal findings: Secondary | ICD-10-CM

## 2019-11-13 DIAGNOSIS — Z0000 Encounter for general adult medical examination without abnormal findings: Secondary | ICD-10-CM | POA: Insufficient documentation

## 2019-11-13 DIAGNOSIS — G629 Polyneuropathy, unspecified: Secondary | ICD-10-CM

## 2019-11-13 DIAGNOSIS — Z13220 Encounter for screening for lipoid disorders: Secondary | ICD-10-CM

## 2019-11-13 LAB — LIPID PANEL
Cholesterol: 191 mg/dL (ref 0–200)
HDL: 39.5 mg/dL (ref >39.00)
LDL Cholesterol: 140 mg/dL — ABNORMAL HIGH (ref 0–99)
NonHDL: 151.8
Total CHOL/HDL Ratio: 5
Triglycerides: 59 mg/dL (ref 0.0–149.0)
VLDL: 11.8 mg/dL (ref 0.0–40.0)

## 2019-11-13 LAB — COMPREHENSIVE METABOLIC PANEL
ALT: 29 U/L (ref 0–53)
AST: 23 U/L (ref 0–37)
Albumin: 4.6 g/dL (ref 3.5–5.2)
Alkaline Phosphatase: 71 U/L (ref 39–117)
BUN: 28 mg/dL — ABNORMAL HIGH (ref 6–23)
CO2: 27 mEq/L (ref 19–32)
Calcium: 9.3 mg/dL (ref 8.4–10.5)
Chloride: 105 mEq/L (ref 96–112)
Creatinine, Ser: 1.11 mg/dL (ref 0.40–1.50)
GFR: 73.38 mL/min (ref >60.00)
Glucose, Bld: 104 mg/dL — ABNORMAL HIGH (ref 70–99)
Potassium: 4 mEq/L (ref 3.5–5.1)
Sodium: 138 mEq/L (ref 135–145)
Total Bilirubin: 0.5 mg/dL (ref 0.2–1.2)
Total Protein: 7 g/dL (ref 6.0–8.3)

## 2019-11-13 LAB — CBC
HCT: 43.4 % (ref 39.0–52.0)
Hemoglobin: 14.9 g/dL (ref 13.0–17.0)
MCHC: 34.3 g/dL (ref 30.0–36.0)
MCV: 87.8 fl (ref 78.0–100.0)
Platelets: 246 10*3/uL (ref 150.0–400.0)
RBC: 4.94 Mil/uL (ref 4.22–5.81)
RDW: 13.5 % (ref 11.5–15.5)
WBC: 5.4 10*3/uL (ref 4.0–10.5)

## 2019-11-13 LAB — VITAMIN B12: Vitamin B-12: 309 pg/mL (ref 211–911)

## 2019-11-13 LAB — HEMOGLOBIN A1C: Hgb A1c MFr Bld: 5.5 % (ref 4.6–6.5)

## 2019-11-13 NOTE — Assessment & Plan Note (Signed)
Refer to GI.  Suspect hemorrhoidal bleeding given description.

## 2019-11-13 NOTE — Assessment & Plan Note (Addendum)
Undetermined cause.  B12 and A1c ordered to evaluate.  Discussed the potential for neurology referral if labs are normal.

## 2019-11-13 NOTE — Assessment & Plan Note (Signed)
Physical exam completed.  Encourage diet and exercise.  Encouraged him to get the second Covid vaccine.  Discussed that he would still need to wear a mask and practice good social distancing.  Lab work as outlined below.

## 2019-11-13 NOTE — Patient Instructions (Signed)
Nice to see you. We will get labs today and contact you with the results. We will refer you to GI for the rectal bleeding.

## 2019-11-13 NOTE — Progress Notes (Signed)
Martin Rumps, MD Phone: [phone]  Martin Dunn is a 39 y.o. male who presents today for CPE.  Diet: Wife cooks healthy food.  Some junk food.  No soda or sweet tea. Exercise: Tries to walk 30 minutes a day for exercise. Family history: no history of prostate or colon cancer Vaccines-   Flu: Not in season  Tetanus: Up-to-date  COVID19: Has had his first vaccine, second is scheduled HIV screening: 2015, negative Tobacco use: no Alcohol use: 2-4 per week Illicit Drug use: no Dentist: yes Ophthalmology: yes  Neuropathy: Patient notes recently he has felt tingling in his hands and feet.  At first it was just at night though now occurs randomly throughout the day.  It can occur in both hands and both feet though never on both sides at the same time.  Some associated numbness.  Notes it lasts briefly and goes away on its own.  Rectal bleeding: This has been going on for several years.  Typically occurs once a month.  Not consistent with any cause.  Typically has blood on the toilet paper and in the toilet water.  Stools are brown and green.  No history of hemorrhoids.  Notes its bright red blood.   Active Ambulatory Problems    Diagnosis Date Noted  . GERD (gastroesophageal reflux disease) 09/20/2018  . HSV infection 09/20/2018  . Spermatocytic seminoma ([place]) 09/20/2018  . History of irregular heartbeat 09/20/2018  . Encounter for general adult medical examination with abnormal findings 11/13/2019  . Rectal bleeding 11/13/2019  . Neuropathy 11/13/2019   Resolved Ambulatory Problems    Diagnosis Date Noted  . No Resolved Ambulatory Problems   Past Medical History:  Diagnosis Date  . Chicken pox   . Mass of right testis     Family History  Problem Relation Age of Onset  . Arthritis Mother   . Asthma Mother   . Depression Mother   . Miscarriages / [place] Mother   . Diabetes Father   . Hearing loss Father   . Heart attack Father   . Hypertension Father   .  Arthritis Maternal Grandmother   . COPD Maternal Grandfather   . Heart disease Paternal Grandmother     Social History   Socioeconomic History  . Marital status: Married    Spouse name: Not on file  . Number of children: Not on file  . Years of education: Not on file  . Highest education level: Not on file  Occupational History  . Not on file  Tobacco Use  . Smoking status: Never Smoker  . Smokeless tobacco: Former Systems developer    Types: Chew  Substance and Sexual Activity  . Alcohol use: Yes    Comment: social  . Drug use: No  . Sexual activity: Yes  Other Topics Concern  . Not on file  Social History Narrative  . Not on file   Social Determinants of Health   Financial Resource Strain:   . Difficulty of Paying Living Expenses:   Food Insecurity:   . Worried About Charity fundraiser in the Last Year:   . Arboriculturist in the Last Year:   Transportation Needs:   . Film/video editor (Medical):   Marland Kitchen Lack of Transportation (Non-Medical):   Physical Activity:   . Days of Exercise per Week:   . Minutes of Exercise per Session:   Stress:   . Feeling of Stress :   Social Connections:   . Frequency of Communication  with Friends and Family:   . Frequency of Social Gatherings with Friends and Family:   . Attends Religious Services:   . Active Member of Clubs or Organizations:   . Attends Archivist Meetings:   Marland Kitchen Marital Status:   Intimate Partner Violence:   . Fear of Current or Ex-Partner:   . Emotionally Abused:   Marland Kitchen Physically Abused:   . Sexually Abused:     ROS  General:  Negative for nexplained weight loss, fever Skin: Negative for new or changing mole, sore that won't heal HEENT: Negative for trouble hearing, trouble seeing, ringing in ears, mouth sores, hoarseness, change in voice, dysphagia. CV:  Negative for chest pain, dyspnea, edema, palpitations Resp: Negative for cough, dyspnea, hemoptysis GI: Positive for rectal bleeding, negative for  nausea, vomiting, diarrhea, constipation, abdominal pain, melena. GU: Negative for dysuria, incontinence, urinary hesitance, hematuria, vaginal or penile discharge, polyuria, sexual difficulty, lumps in testicle or breasts MSK: Negative for muscle cramps or aches, joint pain or swelling Neuro: Positive for numbness, negative for headaches, weakness, dizziness, passing out/fainting Psych: Negative for depression, anxiety, memory problems  Objective  Physical Exam Vitals:   11/13/19 0937  BP: 110/70  Pulse: 72  Temp: (!) 96.2 F (35.7 C)  SpO2: 97%    BP Readings from Last 3 Encounters:  11/13/19 110/70  06/23/18 121/74  11/24/16 (!) 107/56   Wt Readings from Last 3 Encounters:  11/13/19 256 lb 3.2 oz (116.2 kg)  06/23/18 250 lb (113.4 kg)  11/24/16 241 lb 8 oz (109.5 kg)    Physical Exam Constitutional:      General: He is not in acute distress.    Appearance: He is not diaphoretic.  HENT:     Head: Normocephalic and atraumatic.  Cardiovascular:     Rate and Rhythm: Normal rate and regular rhythm.     Heart sounds: Normal heart sounds.  Pulmonary:     Effort: Pulmonary effort is normal.     Breath sounds: Normal breath sounds.  Abdominal:     General: Bowel sounds are normal. There is no distension.     Palpations: Abdomen is soft.     Tenderness: There is no abdominal tenderness. There is no guarding or rebound.  Genitourinary:    Comments: Normal external rectum Musculoskeletal:     Right lower leg: No edema.     Left lower leg: No edema.  Skin:    General: Skin is warm and dry.  Neurological:     Mental Status: He is alert.     Comments: 5/5 strength in bilateral biceps, triceps, grip, quads, hamstrings, plantar and dorsiflexion, sensation to light touch intact in bilateral UE and LE, normal gait, 2+ patellar, biceps, and brachioradialis reflexes  Psychiatric:        Mood and Affect: Mood normal.      Assessment/Plan:   Encounter for general adult  medical examination with abnormal findings Physical exam completed.  Encourage diet and exercise.  Encouraged him to get the second Covid vaccine.  Discussed that he would still need to wear a mask and practice good social distancing.  Lab work as outlined below.  Rectal bleeding Refer to GI.  Suspect hemorrhoidal bleeding given description.  Neuropathy Undetermined cause.  B12 and A1c ordered to evaluate.   Orders Placed This Encounter  Procedures  . Comp Met (CMET)  . Lipid panel  . Hemoglobin A1c  . B12  . CBC  . Ambulatory referral to Gastroenterology  Referral Priority:   Routine    Referral Type:   Consultation    Referral Reason:   Specialty Services Required    Number of Visits Requested:   1    No orders of the defined types were placed in this encounter.   This visit occurred during the SARS-CoV-2 public health emergency.  Safety protocols were in place, including screening questions prior to the visit, additional usage of staff PPE, and extensive cleaning of exam room while observing appropriate contact time as indicated for disinfecting solutions.    Martin Rumps, MD [place]

## 2019-11-20 ENCOUNTER — Telehealth: Payer: Self-pay | Admitting: Family Medicine

## 2019-11-20 NOTE — Telephone Encounter (Signed)
"  Rejection Reason - Patient did not respond" [facility] said about 1 hour ago  I also called pt and left vm for pt to call [facility]

## 2019-11-20 NOTE — Telephone Encounter (Signed)
I called pt and left vm to call [place] number given was [phone].

## 2019-11-21 ENCOUNTER — Encounter: Payer: Self-pay | Admitting: Gastroenterology

## 2020-01-01 ENCOUNTER — Ambulatory Visit (INDEPENDENT_AMBULATORY_CARE_PROVIDER_SITE_OTHER): Payer: 59 | Admitting: Gastroenterology

## 2020-01-01 ENCOUNTER — Other Ambulatory Visit: Payer: Self-pay

## 2020-01-01 ENCOUNTER — Encounter: Payer: Self-pay | Admitting: Gastroenterology

## 2020-01-01 VITALS — BP 110/78 | HR 60 | Ht 72.0 in | Wt 257.6 lb

## 2020-01-01 DIAGNOSIS — K219 Gastro-esophageal reflux disease without esophagitis: Secondary | ICD-10-CM

## 2020-01-01 DIAGNOSIS — K625 Hemorrhage of anus and rectum: Secondary | ICD-10-CM | POA: Diagnosis not present

## 2020-01-01 DIAGNOSIS — R140 Abdominal distension (gaseous): Secondary | ICD-10-CM

## 2020-01-01 MED ORDER — SUTAB 1479-225-188 MG PO TABS: 1.0000 | ORAL_TABLET | Freq: Once | ORAL | 0 refills | Status: AC

## 2020-01-01 NOTE — Patient Instructions (Addendum)
Normal BMI (Body Mass Index- based on height and weight) is between 19 and 25. Your BMI today is Body mass index is 34.94 kg/m. Marland Kitchen Please consider follow up  regarding your BMI with your Primary Care Provider.  You have been scheduled for a colonoscopy. Please follow written instructions given to you at your visit today.  Please pick up your prep supplies at the pharmacy within the next 1-3 days. If you use inhalers (even only as needed), please bring them with you on the day of your procedure. Your physician has requested that you go to www.startemmi.com and enter the access code given to you at your visit today. This web site gives a general overview about your procedure. However, you should still follow specific instructions given to you by our office regarding your preparation for the procedure.   Thank you for entrusting me with your care and for choosing [facility], Dr. Roundup Cellar

## 2020-01-01 NOTE — Progress Notes (Signed)
HPI :  40 y/o male with a history of spermatocytic seminoma s/p orchiectomy, GERD, rectal bleeding, referred by Tommi Rumps, MD for rectal bleeding.  He endorses intermittent rectal bleeding occurring on and off for several years. Endorses bright red blood per rectum both in the stools and on the toilet paper, varies. Sometimes can be dark red as well. Volume can vary, sometimes "significant", sometimes minimal. Denies any perianal pain with the bleeding. No symptoms of hemorrhoids - prolapse / irritation, etc. He has 2 BMs per day, no straining or diarrhea. No abdominal pains. No recent bowel changes otherwise. No FH of CRC or IBD. No prior colonoscopy. Hgg 14.9 this past April.  He otherwise has a history of GERD, states he was bothered by regurgitation historically, bothered him quite a bit more than 10 years ago however in recent years this has not been an issue, managed mostly with diet and controlled, does not take any antacids. No dysphagia.  He does have bloating / gas that bothers him, as well as belching at times. Usually occurs in the postprandial state. States it bothers his wife more than him. Denies much benefit with a bowel movement. No weight loss, otherwise feels well. Has not tried anything for this issue, also longstanding.   11/13/19 - Hgb 14.9, MCV 87.8, WBC 5.4  Past Medical History:  Diagnosis Date  . Chicken pox   . GERD (gastroesophageal reflux disease)   . Mass of right testis   . Spermatocytic seminoma [facility])      Past Surgical History:  Procedure Laterality Date  . ORCHIECTOMY Right 11/24/2016   Procedure: ORCHIECTOMY;  Surgeon: Franchot Gallo, MD;  Location[facility];  Service: Urology;  Laterality: Right;  ONLY NEED 45 MINUTES TOTAL FOR BOTH PROCEDURES  . VASECTOMY Left 11/24/2016   Procedure: VASECTOMY;  Surgeon: Franchot Gallo, MD;  [facility];  Service: Urology;  Laterality: Left;   Family History    Problem Relation Age of Onset  . Arthritis Mother   . Asthma Mother   . Depression Mother   . Miscarriages / [place] Mother   . Diabetes Father   . Hearing loss Father   . Heart attack Father   . Hypertension Father   . Arthritis Maternal Grandmother   . COPD Maternal Grandfather   . Heart disease Paternal Grandmother    Social History   Tobacco Use  . Smoking status: Never Smoker  . Smokeless tobacco: Former Systems developer    Types: Chew  Substance Use Topics  . Alcohol use: Yes    Comment: social  . Drug use: No   Current Outpatient Medications  Medication Sig Dispense Refill  . ibuprofen (ADVIL,MOTRIN) 200 MG tablet Take by mouth as needed.     . Multiple Vitamins-Minerals (MENS MULTIVITAMIN PLUS PO) Take by mouth daily.    . valACYclovir (VALTREX) 1000 MG tablet Take 2 tablets (2,000 mg total) by mouth 2 (two) times daily. Take for one day at first sign of symptoms. (Patient taking differently: Take 2,000 mg by mouth 2 (two) times daily as needed. Take for one day at first sign of symptoms.) 20 tablet 0   No current facility-administered medications for this visit.   Allergies  Allergen Reactions  . Sulfa Antibiotics     Childhood allergy   . Penicillins Rash    Childhood allergy     Review of Systems: All systems reviewed and negative except where noted in HPI.   Lab Results  Component Value Date   WBC 5.4 11/13/2019   HGB 14.9 11/13/2019   HCT 43.4 11/13/2019   MCV 87.8 11/13/2019   PLT 246.0 11/13/2019    Lab Results  Component Value Date   CREATININE 1.11 11/13/2019   BUN 28 (H) 11/13/2019   NA 138 11/13/2019   K 4.0 11/13/2019   CL 105 11/13/2019   CO2 27 11/13/2019    Lab Results  Component Value Date   ALT 29 11/13/2019   AST 23 11/13/2019   ALKPHOS 71 11/13/2019   BILITOT 0.5 11/13/2019     Physical Exam: Ht 6' (1.829 m)   Wt 257 lb 9.6 oz (116.8 kg)   BMI 34.94 kg/m  Constitutional: Pleasant,well-developed, male in no acute  distress. HEENT: Normocephalic and atraumatic. Conjunctivae are normal. No scleral icterus. Neck supple.  Cardiovascular: Normal rate, regular rhythm.  Pulmonary/chest: Effort normal and breath sounds normal. No wheezing, rales or rhonchi. Abdominal: Soft, nondistended, nontender. There are no masses palpable.  Extremities: no edema Lymphadenopathy: No cervical adenopathy noted. Neurological: Alert and oriented to person place and time. Skin: Skin is warm and dry. No rashes noted. Psychiatric: Normal mood and affect. Behavior is normal.   ASSESSMENT AND PLAN: 39 y/o male here for new patient assessment of the following:  Rectal bleeding - as above, longstanding intermittent rectal bleeding with normal Hgb. Discussed ddx with him. He does not have any other hemorrhoidal symptoms but hemorrhoids certainly possible as the cause. That being said, given persistent of symptoms over the years and his age, recommend colonoscopy to clarify what is causing these symptoms and ensure no bleeding polyp / mass lesion. Discussed colonoscopy, risks / benefits, he wishes to proceed.  Further recommendations pending results.  Of note DRE deferred today given planned colonoscopy regardless of findings.  Bloating - discussed intestinal gas and bloating with him.  Recommend a trial of low FODMAP diet as well as trial of Gas-X.  Sounds like this is more well problem for him than actual reflux symptoms, can continue to manage reflux with dietary measures otherwise.  Vadito Cellar, MD [facility]  CC: Leone Haven, MD

## 2020-02-13 ENCOUNTER — Encounter: Payer: Self-pay | Admitting: Gastroenterology

## 2020-02-19 ENCOUNTER — Other Ambulatory Visit: Payer: Self-pay

## 2020-02-19 ENCOUNTER — Ambulatory Visit (AMBULATORY_SURGERY_CENTER): Payer: 59 | Admitting: Gastroenterology

## 2020-02-19 ENCOUNTER — Encounter: Payer: Self-pay | Admitting: Gastroenterology

## 2020-02-19 VITALS — BP 111/69 | HR 53 | Temp 97.2°F | Resp 11 | Ht 72.0 in | Wt 257.0 lb

## 2020-02-19 DIAGNOSIS — K5730 Diverticulosis of large intestine without perforation or abscess without bleeding: Secondary | ICD-10-CM | POA: Diagnosis not present

## 2020-02-19 DIAGNOSIS — K648 Other hemorrhoids: Secondary | ICD-10-CM

## 2020-02-19 DIAGNOSIS — K625 Hemorrhage of anus and rectum: Secondary | ICD-10-CM

## 2020-02-19 MED ORDER — AMBULATORY NON FORMULARY MEDICATION: 1 refills | Status: AC

## 2020-02-19 MED ORDER — SODIUM CHLORIDE 0.9 % IV SOLN: 500.0000 mL | Freq: Once | INTRAVENOUS | Status: DC

## 2020-02-19 NOTE — Op Note (Signed)
[facility] Patient Name: Martin Dunn Procedure Date: 02/19/2020 10:00 AM MRN: [id number] Endoscopist: Remo Lipps P. Havery Moros , MD Age: 39 Referring MD:  Date of Birth: 10-19-1980 Gender: Male Account #: [id number] Procedure:                Colonoscopy Indications:              Rectal bleeding Medicines:                Monitored Anesthesia Care Procedure:                Pre-Anesthesia Assessment:                           - Prior to the procedure, a History and Physical                            was performed, and patient medications and                            allergies were reviewed. The patient's tolerance of                            previous anesthesia was also reviewed. The risks                            and benefits of the procedure and the sedation                            options and risks were discussed with the patient.                            All questions were answered, and informed consent                            was obtained. Prior Anticoagulants: The patient has                            taken no previous anticoagulant or antiplatelet                            agents. ASA Grade Assessment: II - A patient with                            mild systemic disease. After reviewing the risks                            and benefits, the patient was deemed in                            satisfactory condition to undergo the procedure.                           After obtaining informed consent, the colonoscope  was passed under direct vision. Throughout the                            procedure, the patient's blood pressure, pulse, and                            oxygen saturations were monitored continuously. The                            Colonoscope was introduced through the anus and                            advanced to the the terminal ileum, with                            identification of the appendiceal orifice and IC                             valve. The colonoscopy was performed without                            difficulty. The patient tolerated the procedure                            well. The quality of the bowel preparation was                            adequate. The terminal ileum, ileocecal valve,                            appendiceal orifice, and rectum were photographed. Scope In: 10:07:23 AM Scope Out: 10:30:38 AM Scope Withdrawal Time: 0 hours 20 minutes 11 seconds  Total Procedure Duration: 0 hours 23 minutes 15 seconds  Findings:                 An anal fissure was found on perianal exam.                           The terminal ileum appeared normal.                           A few small-mouthed diverticula were found in the                            transverse colon.                           Internal hemorrhoids were found during retroflexion.                           The exam was otherwise without abnormality. Complications:            No immediate complications. Estimated blood loss:  None. Estimated Blood Loss:     Estimated blood loss: none. Impression:               - Anal fissure found on perianal exam.                           - The examined portion of the ileum was normal.                           - Diverticulosis in the transverse colon.                           - Internal hemorrhoids.                           - The examination was otherwise normal.                           - No polyps                           Overall suspect bleeding is coming from fissure or                            hemorrhoids. Recommendation:           - Patient has a contact number available for                            emergencies. The signs and symptoms of potential                            delayed complications were discussed with the                            patient. Return to normal activities tomorrow.                            Written discharge  instructions were provided to the                            patient.                           - Resume previous diet.                           - Continue present medications.                           - Trial of daily fiber supplement (Citrucel)                           - Trial of topical 0.125% nitroglycerin ointment PR                            three times daily to treat fissure                           -  Contact me if symptoms persist despite management                           - Repeat colonoscopy in 10 years for screening                            purposes. Remo Lipps P. Jakeel Starliper, MD 02/19/2020 10:34:58 AM This report has been signed electronically.

## 2020-02-19 NOTE — Patient Instructions (Signed)
HANDOUTS PROVIDED ON: DIVERTICULOSIS & HEMORRHOIDS  Your next colonoscopy should occur in 10 years.  You may resume your previous diet and medication schedule.   You have been given a prescription for 0.125% Nitroglycerin.  You should apply a pea size amount to your rectum three times daily. Please take your printed prescription to [facility].  [facility]'s information is below:  Address: [address], [place],  [address]  [phone]    Thank you for allowing [place] to care for you today!!!   YOU HAD AN ENDOSCOPIC PROCEDURE TODAY AT [place]:   Refer to the procedure report that was given to you for any specific questions about what was found during the examination.  If the procedure report does not answer your questions, please call your gastroenterologist to clarify.  If you requested that your care partner not be given the details of your procedure findings, then the procedure report has been included in a sealed envelope for you to review at your convenience later.  YOU SHOULD EXPECT: Some feelings of bloating in the abdomen. Passage of more gas than usual.  Walking can help get rid of the air that was put into your GI tract during the procedure and reduce the bloating. If you had a lower endoscopy (such as a colonoscopy or flexible sigmoidoscopy) you may notice spotting of blood in your stool or on the toilet paper. If you underwent a bowel prep for your procedure, you may not have a normal bowel movement for a few days.  Please Note:  You might notice some irritation and congestion in your nose or some drainage.  This is from the oxygen used during your procedure.  There is no need for concern and it should clear up in a day or so.  SYMPTOMS TO REPORT IMMEDIATELY:   Following lower endoscopy (colonoscopy or flexible sigmoidoscopy):  Excessive amounts of blood in the stool  Significant tenderness or worsening of abdominal  pains  Swelling of the abdomen that is new, acute  Fever of 100F or higher  For urgent or emergent issues, a gastroenterologist can be reached at any hour by calling [phone]. Do not use MyChart messaging for urgent concerns.    DIET:  We do recommend a small meal at first, but then you may proceed to your regular diet.  Drink plenty of fluids but you should avoid alcoholic beverages for 24 hours.  ACTIVITY:  You should plan to take it easy for the rest of today and you should NOT DRIVE or use heavy machinery until tomorrow (because of the sedation medicines used during the test).    FOLLOW UP: Our staff will call the number listed on your records 48-72 hours following your procedure to check on you and address any questions or concerns that you may have regarding the information given to you following your procedure. If we do not reach you, we will leave a message.  We will attempt to reach you two times.  During this call, we will ask if you have developed any symptoms of COVID 19. If you develop any symptoms (ie: fever, flu-like symptoms, shortness of breath, cough etc.) before then, please call [phone].  If you test positive for Covid 19 in the 2 weeks post procedure, please call and report this information to [place].    If any biopsies were taken you will be contacted by phone or by letter within the next 1-3 weeks.  Please call us  at [phone] if you have not heard about the biopsies in 3 weeks.    SIGNATURES/CONFIDENTIALITY: You and/or your care partner have signed paperwork which will be entered into your electronic medical record.  These signatures attest to the fact that that the information above on your After Visit Summary has been reviewed and is understood.  Full responsibility of the confidentiality of this discharge information lies with you and/or your care-partner.

## 2020-02-19 NOTE — Progress Notes (Signed)
A/ox3, pleased with MAC, report to RN 

## 2020-02-19 NOTE — Progress Notes (Signed)
Vitals-CW Temp-JB  Pt's states no medical or surgical changes since previsit or office visit. 

## 2020-02-23 ENCOUNTER — Telehealth: Payer: Self-pay | Admitting: *Deleted

## 2020-02-23 NOTE — Telephone Encounter (Signed)
°  Follow up Call-  Call back number 02/19/2020  Post procedure Call Back phone  # [phone]  Permission to leave phone message Yes  Some recent data might be hidden     Patient questions:  Do you have a fever, pain , or abdominal swelling? No. Pain Score  0 *  Have you tolerated food without any problems? Yes.    Have you been able to return to your normal activities? Yes.    Do you have any questions about your discharge instructions: Diet   No. Medications  No. Follow up visit  No.  Do you have questions or concerns about your Care? No.  Actions: * If pain score is 4 or above: No action needed, pain <4.  1. Have you developed a fever since your procedure? no  2.   Have you had an respiratory symptoms (SOB or cough) since your procedure? no  3.   Have you tested positive for COVID 19 since your procedure no  4.   Have you had any family members/close contacts diagnosed with the COVID 19 since your procedure?  no   If yes to any of these questions please route to Joylene John, RN and Erenest Rasher, RN

## 2020-04-28 ENCOUNTER — Other Ambulatory Visit: Payer: Self-pay | Admitting: Urology

## 2020-04-28 ENCOUNTER — Ambulatory Visit
Admission: RE | Admit: 2020-04-28 | Discharge: 2020-04-28 | Disposition: A | Discharge status: Home / Self Care | Payer: 59 | Source: Ambulatory Visit | Attending: Urology | Admitting: Urology

## 2020-04-28 ENCOUNTER — Ambulatory Visit
Admission: RE | Admit: 2020-04-28 | Discharge: 2020-04-28 | Disposition: A | Discharge status: Home / Self Care | Payer: 59 | Attending: Urology | Admitting: Urology

## 2020-04-28 ENCOUNTER — Other Ambulatory Visit: Payer: Self-pay

## 2020-04-28 DIAGNOSIS — C6210 Malignant neoplasm of unspecified descended testis: Secondary | ICD-10-CM

## 2020-07-09 ENCOUNTER — Other Ambulatory Visit: Payer: Self-pay

## 2020-07-09 ENCOUNTER — Ambulatory Visit
Admission: RE | Admit: 2020-07-09 | Discharge: 2020-07-09 | Disposition: A | Discharge status: Home / Self Care | Payer: 59 | Attending: Urology | Admitting: Urology

## 2020-11-15 ENCOUNTER — Encounter: Payer: 59 | Admitting: Family Medicine

## 2021-01-05 ENCOUNTER — Encounter: Payer: 59 | Admitting: Family Medicine

## 2021-02-04 ENCOUNTER — Other Ambulatory Visit: Payer: Self-pay

## 2021-02-04 ENCOUNTER — Ambulatory Visit
Admission: RE | Admit: 2021-02-04 | Discharge: 2021-02-04 | Disposition: A | Discharge status: Home / Self Care | Payer: 59 | Source: Ambulatory Visit | Attending: Family Medicine | Admitting: Family Medicine

## 2021-02-04 VITALS — BP 131/88 | HR 67 | Temp 98.2°F | Resp 18 | Ht 72.0 in | Wt 250.0 lb

## 2021-02-04 DIAGNOSIS — J069 Acute upper respiratory infection, unspecified: Secondary | ICD-10-CM | POA: Diagnosis not present

## 2021-02-04 MED ORDER — IPRATROPIUM BROMIDE 0.06 % NA SOLN: 2.0000 | Freq: Four times a day (QID) | NASAL | 0 refills | Status: DC | PRN

## 2021-02-04 MED ORDER — PROMETHAZINE-DM 6.25-15 MG/5ML PO SYRP
5.0000 mL | ORAL_SOLUTION | Freq: Four times a day (QID) | ORAL | 0 refills | Status: DC | PRN
Start: 2021-02-04 — End: 2021-02-17

## 2021-02-04 NOTE — ED Provider Notes (Signed)
[facility]    CSN: [id number] Arrival date & time: 02/04/21  1301      History   Chief Complaint Chief Complaint  Patient presents with   Cough   Nasal Congestion    HPI  40 year old male presents with above complaints.  Patient reports that he has had respiratory symptoms for the past 3 days.  He reports that he initially had sore throat but this resolved.  He is now bothered by sinus congestion and cough.  He has had some hoarseness as well.  No fever.  He has taken 2 home COVID test which have been negative.  No relieving factors.  No other complaints.  Past Medical History:  Diagnosis Date   Chicken pox    GERD (gastroesophageal reflux disease)    Mass of right testis    Spermatocytic seminoma [facility])     Patient Active Problem List   Diagnosis Date Noted   Encounter for general adult medical examination with abnormal findings 11/13/2019   Rectal bleeding 11/13/2019   Neuropathy 11/13/2019   GERD (gastroesophageal reflux disease) 09/20/2018   HSV infection 09/20/2018   Spermatocytic seminoma ([place]) 09/20/2018   History of irregular heartbeat 09/20/2018    Past Surgical History:  Procedure Laterality Date   ORCHIECTOMY Right 11/24/2016   Procedure: ORCHIECTOMY;  Surgeon: Franchot Gallo, MD;  Location: [facility];  Service: Urology;  Laterality: Right;  ONLY NEED 45 MINUTES TOTAL FOR BOTH PROCEDURES   VASECTOMY Left 11/24/2016   Procedure: VASECTOMY;  Surgeon: Franchot Gallo, MD;  Location: [facility];  Service: Urology;  Laterality: Left;       Home Medications    Prior to Admission medications   Medication Sig Start Date End Date Taking? Authorizing Provider  ipratropium (ATROVENT) 0.06 % nasal spray Place 2 sprays into both nostrils 4 (four) times daily as needed for rhinitis. 02/04/21  Yes Jaycion Treml G, DO  promethazine-dextromethorphan (PROMETHAZINE-DM) 6.25-15 MG/5ML syrup Take 5 mLs by mouth 4 (four) times  daily as needed for cough. 02/04/21  Yes Michayla Mcneil G, DO  AMBULATORY NON FORMULARY MEDICATION Medication Name: Nitroglycerin 0.125% gel. Apply pea size amount to the rectum three times a day for 6-8 weeks 02/19/20   Armbruster, Carlota Raspberry, MD  betamethasone dipropionate 0.05 % cream Apply topically 2 (two) times daily as needed.    [provider]  ibuprofen (ADVIL,MOTRIN) 200 MG tablet Take by mouth as needed.     [provider]  Multiple Vitamins-Minerals (MENS MULTIVITAMIN PLUS PO) Take by mouth daily.    [provider]  valACYclovir (VALTREX) 1000 MG tablet Take 2 tablets (2,000 mg total) by mouth 2 (two) times daily. Take for one day at first sign of symptoms. Patient taking differently: Take 2,000 mg by mouth 2 (two) times daily as needed. Take for one day at first sign of symptoms. 09/20/18   Leone Haven, MD    Family History Family History  Problem Relation Age of Onset   Arthritis Mother    Asthma Mother    Depression Mother    Miscarriages / [place] Mother    Diabetes Father    Hearing loss Father    Heart attack Father    Hypertension Father    Arthritis Maternal Grandmother    COPD Maternal Grandfather    Heart disease Paternal Grandmother    Stomach cancer Maternal Uncle    Esophageal cancer Paternal Uncle    Colon cancer Neg Hx  Rectal cancer Neg Hx     Social History Social History   Tobacco Use   Smoking status: Never   Smokeless tobacco: Former    Types: Chew    Quit date: 11/21/2001  Vaping Use   Vaping Use: Never used  Substance Use Topics   Alcohol use: Yes    Comment: social   Drug use: No     Allergies   Sulfa antibiotics and Penicillins   Review of Systems Review of Systems Per HPI  Physical Exam Triage Vital Signs ED Triage Vitals  Enc Vitals Group     BP 02/04/21 1344 131/88     Pulse Rate 02/04/21 1344 67     Resp 02/04/21 1344 18     Temp 02/04/21 1344 98.2 F (36.8 C)     Temp Source  02/04/21 1344 Oral     SpO2 02/04/21 1344 99 %     Weight 02/04/21 1342 250 lb (113.4 kg)     Height 02/04/21 1342 6' (1.829 m)     Head Circumference --      Peak Flow --      Pain Score 02/04/21 1342 0     Pain Loc --      Pain Edu? --      Excl. in [place]? --    Updated Vital Signs BP 131/88 (BP Location: Left Arm)   Pulse 67   Temp 98.2 F (36.8 C) (Oral)   Resp 18   Ht 6' (1.829 m)   Wt 113.4 kg   SpO2 99%   BMI 33.91 kg/m   Visual Acuity Right Eye Distance:   Left Eye Distance:   Bilateral Distance:    Right Eye Near:   Left Eye Near:    Bilateral Near:     Physical Exam Constitutional:      General: He is not in acute distress.    Appearance: Normal appearance. He is not ill-appearing.  HENT:     Head: Normocephalic and atraumatic.     Right Ear: Tympanic membrane normal.     Left Ear: Tympanic membrane normal.     Mouth/Throat:     Pharynx: Oropharynx is clear. No oropharyngeal exudate or posterior oropharyngeal erythema.  Cardiovascular:     Rate and Rhythm: Normal rate and regular rhythm.  Pulmonary:     Effort: Pulmonary effort is normal.     Breath sounds: Normal breath sounds. No wheezing, rhonchi or rales.  Neurological:     Mental Status: He is alert.  Psychiatric:        Mood and Affect: Mood normal.     UC Treatments / Results  Labs (all labs ordered are listed, but only abnormal results are displayed) Labs Reviewed - No data to display  EKG   Radiology No results found.  Procedures Procedures (including critical care time)  Medications Ordered in UC Medications - No data to display  Initial Impression / Assessment and Plan / UC Course  I have reviewed the triage vital signs and the nursing notes.  Pertinent labs & imaging results that were available during my care of the patient were reviewed by me and considered in my medical decision making (see chart for details).    40 year old male presents with a viral URI with cough.   Treating with Promethazine DM and Atrovent nasal spray.  Final Clinical Impressions(s) / UC Diagnoses   Final diagnoses:  Viral URI with cough     Discharge Instructions  This is viral.  Your exam and vitals are reassuring.  Use the medication as directed for cough and congestion.  Take care  Dr. Lacinda Axon   ED Prescriptions     Medication Sig Dispense Auth. Provider   promethazine-dextromethorphan (PROMETHAZINE-DM) 6.25-15 MG/5ML syrup Take 5 mLs by mouth 4 (four) times daily as needed for cough. 118 mL Obinna Ehresman G, DO   ipratropium (ATROVENT) 0.06 % nasal spray Place 2 sprays into both nostrils 4 (four) times daily as needed for rhinitis. 15 mL Coral Spikes, DO      PDMP not reviewed this encounter.   Coral Spikes, [place] 02/04/21 1805

## 2021-02-04 NOTE — ED Triage Notes (Signed)
Pt c/o sinus pressure/congestion, cough, hoarseness for about 3 days. Pt denies f/n/v/d, headache, body aches or other symptoms. Pt has taken 2 at-home COVID tests in the past 2 days and each are negative.

## 2021-02-04 NOTE — Discharge Instructions (Addendum)
This is viral.  Your exam and vitals are reassuring.  Use the medication as directed for cough and congestion.  Take care  Dr. Lacinda Axon

## 2021-02-09 ENCOUNTER — Encounter: Payer: Self-pay | Admitting: Family Medicine

## 2021-02-10 NOTE — Telephone Encounter (Signed)
Pt scheduled for VV with Allie Bossier, NP @ 7:20 tomorrow morning.

## 2021-02-11 ENCOUNTER — Telehealth (INDEPENDENT_AMBULATORY_CARE_PROVIDER_SITE_OTHER): Payer: 59 | Admitting: Primary Care

## 2021-02-11 ENCOUNTER — Encounter: Payer: Self-pay | Admitting: Primary Care

## 2021-02-11 VITALS — Ht 72.0 in | Wt 250.0 lb

## 2021-02-11 DIAGNOSIS — R059 Cough, unspecified: Secondary | ICD-10-CM

## 2021-02-11 MED ORDER — AZITHROMYCIN 250 MG PO TABS: ORAL_TABLET | ORAL | 0 refills | Status: DC

## 2021-02-11 NOTE — Patient Instructions (Signed)
Start Azithromycin antibiotics for infection. Take 2 tablets by mouth today, then 1 tablet daily for 4 additional days.  Start an antihistamine such as Claritin/Zyrtec/Allegra for throat drainage.   Please follow up with your PCP if no improvement in 3-4 days.   It was a pleasure meeting you!

## 2021-02-11 NOTE — Progress Notes (Signed)
Patient ID: Martin Dunn, male    DOB: [birth year], 40 y.o.   MRN: [id number]  Virtual visit completed through [place], a video enabled telemedicine application. Due to national recommendations of social distancing due to COVID-19, a virtual visit is felt to be most appropriate for this patient at this time. Reviewed limitations, risks, security and privacy concerns of performing a virtual visit and the availability of in person appointments. I also reviewed that there may be a patient responsible charge related to this service. The patient agreed to proceed.   Patient location: home Provider location: [place] at [facility], office Persons participating in this virtual visit: patient, provider   If any vitals were documented, they were collected by patient at home unless specified below.    Ht 6' (1.829 m)   Wt 250 lb (113.4 kg)   BMI 33.91 kg/m    CC: Cough Subjective:   HPI: Martin Dunn is a 40 y.o. male patient of Dr. Caryl Bis with a history of GERD presenting on 02/11/2021 for   He was evaluated one week ago at Urgent Care for a three day history of cough with nasal congestion. Prior to his visit he had completed two Covid tests, both of which were negative. His exam was consistent for viral URI so he was treated with promethazine DM cough syrup and Atrovent nasal spray.   Today he continues with a persistent cough, slight nasal discharge, isn't sleeping well, is functioning slower at work. He's been taking the cough suppressant prescribed without improvement. He's not taking anything OTC. Overall he's not feeling much better. Denies fevers.       Relevant past medical, surgical, family and social history reviewed and updated as indicated. Interim medical history since our last visit reviewed. Allergies and medications reviewed and updated. Outpatient Medications Prior to Visit  Medication Sig Dispense Refill   AMBULATORY NON FORMULARY MEDICATION Medication Name:  Nitroglycerin 0.125% gel. Apply pea size amount to the rectum three times a day for 6-8 weeks 30 g 1   betamethasone dipropionate 0.05 % cream Apply topically 2 (two) times daily as needed.     ibuprofen (ADVIL,MOTRIN) 200 MG tablet Take by mouth as needed.      ipratropium (ATROVENT) 0.06 % nasal spray Place 2 sprays into both nostrils 4 (four) times daily as needed for rhinitis. 15 mL 0   Multiple Vitamins-Minerals (MENS MULTIVITAMIN PLUS PO) Take by mouth daily.     promethazine-dextromethorphan (PROMETHAZINE-DM) 6.25-15 MG/5ML syrup Take 5 mLs by mouth 4 (four) times daily as needed for cough. 118 mL 0   valACYclovir (VALTREX) 1000 MG tablet Take 2 tablets (2,000 mg total) by mouth 2 (two) times daily. Take for one day at first sign of symptoms. (Patient taking differently: Take 2,000 mg by mouth 2 (two) times daily as needed. Take for one day at first sign of symptoms.) 20 tablet 0   No facility-administered medications prior to visit.     Per HPI unless specifically indicated in ROS section below Review of Systems Objective:  Ht 6' (1.829 m)   Wt 250 lb (113.4 kg)   BMI 33.91 kg/m   Wt Readings from Last 3 Encounters:  02/11/21 250 lb (113.4 kg)  02/04/21 250 lb (113.4 kg)  02/19/20 257 lb (116.6 kg)       Physical exam: Gen: alert, NAD, appears tired. Pulm: speaks in complete sentences without increased work of breathing, no cough during visit  Psych: normal mood, normal thought content  Results for orders placed or performed in visit on 11/13/19  Comp Met (CMET)  Result Value Ref Range   Sodium 138 135 - 145 mEq/L   Potassium 4.0 3.5 - 5.1 mEq/L   Chloride 105 96 - 112 mEq/L   CO2 27 19 - 32 mEq/L   Glucose, Bld 104 (H) 70 - 99 mg/dL   BUN 28 (H) 6 - 23 mg/dL   Creatinine, Ser 6.41 0.40 - 1.50 mg/dL   Total Bilirubin 0.5 0.2 - 1.2 mg/dL   Alkaline Phosphatase 71 39 - 117 U/L   AST 23 0 - 37 U/L   ALT 29 0 - 53 U/L   Total Protein 7.0 6.0 - 8.3 g/dL   Albumin  4.6 3.5 - 5.2 g/dL   GFR 10.10 >48.24 mL/min   Calcium 9.3 8.4 - 10.5 mg/dL  Lipid panel  Result Value Ref Range   Cholesterol 191 0 - 200 mg/dL   Triglycerides 80.7 0.0 - 149.0 mg/dL   HDL 04.34 >24.55 mg/dL   VLDL 71.2 0.0 - 68.4 mg/dL   LDL Cholesterol 090 (H) 0 - 99 mg/dL   Total CHOL/HDL Ratio 5    NonHDL 151.80   Hemoglobin A1c  Result Value Ref Range   Hgb A1c MFr Bld 5.5 4.6 - 6.5 %  B12  Result Value Ref Range   Vitamin B-12 309 211 - 911 pg/mL  CBC  Result Value Ref Range   WBC 5.4 4.0 - 10.5 K/uL   RBC 4.94 4.22 - 5.81 Mil/uL   Platelets 246.0 150.0 - 400.0 K/uL   Hemoglobin 14.9 13.0 - 17.0 g/dL   HCT 58.8 07.4 - 45.5 %   MCV 87.8 78.0 - 100.0 fl   MCHC 34.3 30.0 - 36.0 g/dL   RDW 85.0 51.2 - 34.7 %   Assessment & Plan:   Problem List Items Addressed This Visit       Other   Cough - Primary    Continued and now for a duration of 10 days, no significant improvement.   We discussed the etiology for his symptoms which could still be viral, but coupled with the duration of symptoms and presentation we will trial antibiotic regimen.   Rx for azithromycin course sent to pharmacy.  Start antihistamine.   Follow up with PCP if no improvement in 3-4 days.       Relevant Medications   azithromycin (ZITHROMAX) 250 MG tablet     Meds ordered this encounter  Medications   azithromycin (ZITHROMAX) 250 MG tablet    Sig: Take 2 tablets by mouth today, then 1 tablet daily for 4 additional days.    Dispense:  6 tablet    Refill:  0    Order Specific Question:   Supervising Provider    Answer:   BEDSOLE, AMY E [[id number]]    No orders of the defined types were placed in this encounter.   I discussed the assessment and treatment plan with the patient. The patient was provided an opportunity to ask questions and all were answered. The patient agreed with the plan and demonstrated an understanding of the instructions. The patient was advised to call back or seek an  in-person evaluation if the symptoms worsen or if the condition fails to improve as anticipated.  Follow up plan:  Start Azithromycin antibiotics for infection. Take 2 tablets by mouth today, then 1 tablet daily for 4 additional days.  Start an antihistamine such as Claritin/Zyrtec/Allegra for throat drainage.  Please follow up with your PCP if no improvement in 3-4 days.   It was a pleasure meeting you!   Pleas Koch, NP

## 2021-02-11 NOTE — Assessment & Plan Note (Signed)
Continued and now for a duration of 10 days, no significant improvement.   We discussed the etiology for his symptoms which could still be viral, but coupled with the duration of symptoms and presentation we will trial antibiotic regimen.   Rx for azithromycin course sent to pharmacy.  Start antihistamine.   Follow up with PCP if no improvement in 3-4 days.

## 2021-02-17 ENCOUNTER — Encounter: Payer: Self-pay | Admitting: Internal Medicine

## 2021-02-17 ENCOUNTER — Ambulatory Visit (INDEPENDENT_AMBULATORY_CARE_PROVIDER_SITE_OTHER): Payer: 59 | Admitting: Internal Medicine

## 2021-02-17 ENCOUNTER — Other Ambulatory Visit: Payer: Self-pay | Admitting: Internal Medicine

## 2021-02-17 ENCOUNTER — Ambulatory Visit
Admission: RE | Admit: 2021-02-17 | Discharge: 2021-02-17 | Disposition: A | Discharge status: Home / Self Care | Payer: 59 | Source: Ambulatory Visit | Attending: Internal Medicine | Admitting: Internal Medicine

## 2021-02-17 ENCOUNTER — Ambulatory Visit
Admission: RE | Admit: 2021-02-17 | Discharge: 2021-02-17 | Disposition: A | Discharge status: Home / Self Care | Payer: 59 | Attending: Internal Medicine | Admitting: Internal Medicine

## 2021-02-17 ENCOUNTER — Other Ambulatory Visit: Payer: Self-pay

## 2021-02-17 DIAGNOSIS — R059 Cough, unspecified: Secondary | ICD-10-CM

## 2021-02-17 MED ORDER — PREDNISONE 10 MG PO TABS: ORAL_TABLET | ORAL | 0 refills | Status: DC

## 2021-02-17 MED ORDER — HYDROCOD POLST-CPM POLST ER 10-8 MG/5ML PO SUER: 5.0000 mL | Freq: Every evening | ORAL | 0 refills | Status: DC | PRN

## 2021-02-17 MED ORDER — BENZONATATE 100 MG PO CAPS: 200.0000 mg | ORAL_CAPSULE | Freq: Three times a day (TID) | ORAL | 0 refills | Status: DC | PRN

## 2021-02-17 MED ORDER — ALBUTEROL SULFATE HFA 108 (90 BASE) MCG/ACT IN AERS
2.0000 | INHALATION_SPRAY | Freq: Four times a day (QID) | RESPIRATORY_TRACT | 11 refills | Status: DC | PRN
Start: 2021-02-17 — End: 2023-06-19

## 2021-02-17 NOTE — Progress Notes (Signed)
Subjective:  Patient ID: Martin Dunn, male    DOB: 04/25/1981  Age: 40 y.o. MRN: [id number]  CC: The encounter diagnosis was Cough.  HPI [name] presents for PERSISTENT COUGH  This visit occurred during the SARS-CoV-2 public health emergency.  Safety protocols were in place, including screening questions prior to the visit, additional usage of staff PPE, and extensive cleaning of exam room while observing appropriate contact time as indicated for disinfecting solutions.   40 yr old male with history of GERD,  testicular CA (spermatocytic seminoma s/p orchiectomy in 2018) presents with 3 week history of persistent cough   HPI:  July 1:  Treated in ED by DR Lacinda Axon for 3 day history of rhinitis with congestion and cough.  COVID NEGATIVE AT HOME X 2.  Given cough suppressant and atrovent nasal spray  July 8:  Treat via video visit for persistent symptoms.  Azithromycin added  5 days  Sympotms have persisted.  Cough is triggered by deep breathing and occurring every 15 to 30 seconds  per [employer] from July 12.    Outpatient Medications Prior to Visit  Medication Sig Dispense Refill   AMBULATORY NON FORMULARY MEDICATION Medication Name: Nitroglycerin 0.125% gel. Apply pea size amount to the rectum three times a day for 6-8 weeks 30 g 1   betamethasone dipropionate 0.05 % cream Apply topically 2 (two) times daily as needed.     ibuprofen (ADVIL,MOTRIN) 200 MG tablet Take by mouth as needed.      Multiple Vitamins-Minerals (MENS MULTIVITAMIN PLUS PO) Take by mouth daily.     valACYclovir (VALTREX) 1000 MG tablet Take 2 tablets (2,000 mg total) by mouth 2 (two) times daily. Take for one day at first sign of symptoms. (Patient taking differently: Take 2,000 mg by mouth 2 (two) times daily as needed. Take for one day at first sign of symptoms.) 20 tablet 0   promethazine-dextromethorphan (PROMETHAZINE-DM) 6.25-15 MG/5ML syrup Take 5 mLs by mouth 4 (four) times daily as needed for cough.  118 mL 0   ipratropium (ATROVENT) 0.06 % nasal spray Place 2 sprays into both nostrils 4 (four) times daily as needed for rhinitis. (Patient not taking: Reported on 02/17/2021) 15 mL 0   azithromycin (ZITHROMAX) 250 MG tablet Take 2 tablets by mouth today, then 1 tablet daily for 4 additional days. (Patient not taking: Reported on 02/17/2021) 6 tablet 0   No facility-administered medications prior to visit.    Review of Systems;  Patient denies headache, fevers, malaise, unintentional weight loss, skin rash, eye pain, sinus congestion and sinus pain, sore throat, dysphagia,  hemoptysis , cough, dyspnea, wheezing, chest pain, palpitations, orthopnea, edema, abdominal pain, nausea, melena, diarrhea, constipation, flank pain, dysuria, hematuria, urinary  Frequency, nocturia, numbness, tingling, seizures,  Focal weakness, Loss of consciousness,  Tremor, insomnia, depression, anxiety, and suicidal ideation.      Objective:  BP (!) 128/92 (BP Location: Left Arm, Patient Position: Sitting, Cuff Size: Large)   Pulse 82   Temp (!) 96.6 F (35.9 C) (Temporal)   Resp 15   Ht 6' (1.829 m)   Wt 254 lb 6.4 oz (115.4 kg)   SpO2 98%   BMI 34.50 kg/m   BP Readings from Last 3 Encounters:  02/17/21 (!) 128/92  02/04/21 131/88  02/19/20 111/69    Wt Readings from Last 3 Encounters:  02/17/21 254 lb 6.4 oz (115.4 kg)  02/11/21 250 lb (113.4 kg)  02/04/21 250 lb (113.4 kg)    General  appearance: alert, cooperative and appears stated age Ears: normal TM's and external ear canals both ears Throat: lips, mucosa, and tongue normal; teeth and gums normal Neck: no adenopathy, no carotid bruit, supple, symmetrical, trachea midline and thyroid not enlarged, symmetric, no tenderness/mass/nodules Back: symmetric, no curvature. ROM normal. No CVA tenderness. Lungs: clear to auscultation bilaterally Heart: regular rate and rhythm, S1, S2 normal, no murmur, click, rub or gallop Abdomen: soft, non-tender;  bowel sounds normal; no masses,  no organomegaly Pulses: 2+ and symmetric Skin: Skin color, texture, turgor normal. No rashes or lesions Lymph nodes: Cervical, supraclavicular, and axillary nodes normal.  Lab Results  Component Value Date   HGBA1C 5.5 11/13/2019    Lab Results  Component Value Date   CREATININE 1.11 11/13/2019    Lab Results  Component Value Date   WBC 5.4 11/13/2019   HGB 14.9 11/13/2019   HCT 43.4 11/13/2019   PLT 246.0 11/13/2019   GLUCOSE 104 (H) 11/13/2019   CHOL 191 11/13/2019   TRIG 59.0 11/13/2019   HDL 39.50 11/13/2019   LDLCALC 140 (H) 11/13/2019   ALT 29 11/13/2019   AST 23 11/13/2019   NA 138 11/13/2019   K 4.0 11/13/2019   CL 105 11/13/2019   CREATININE 1.11 11/13/2019   BUN 28 (H) 11/13/2019   CO2 27 11/13/2019   HGBA1C 5.5 11/13/2019    No results found.  Assessment & Plan:   Problem List Items Addressed This Visit       Unprioritized   Cough    Events of the last 2 weeks reviewed.  Chest x ray normal.  He has had resolution of purulent rhinitis/sputum after taking azithromycin .  Will treat with prednisone and cough suppressant.        I have discontinued Tarique Garson's promethazine-dextromethorphan and azithromycin. I am also having him start on albuterol, predniSONE, chlorpheniramine-HYDROcodone, and benzonatate. Additionally, I am having him maintain his Multiple Vitamins-Minerals (MENS MULTIVITAMIN PLUS PO), ibuprofen, valACYclovir, betamethasone dipropionate, AMBULATORY NON FORMULARY MEDICATION, and ipratropium.  Meds ordered this encounter  Medications   albuterol (VENTOLIN HFA) 108 (90 Base) MCG/ACT inhaler    Sig: Inhale 2 puffs into the lungs every 6 (six) hours as needed for wheezing or shortness of breath.    Dispense:  3.7 g    Refill:  11   predniSONE (DELTASONE) 10 MG tablet    Sig: 6 tablets on Day 1 , then reduce by 1 tablet daily until gone    Dispense:  21 tablet    Refill:  0    chlorpheniramine-HYDROcodone (TUSSIONEX PENNKINETIC ER) 10-8 MG/5ML SUER    Sig: Take 5 mLs by mouth at bedtime as needed.    Dispense:  140 mL    Refill:  0   benzonatate (TESSALON) 100 MG capsule    Sig: Take 2 capsules (200 mg total) by mouth 3 (three) times daily as needed for cough.    Dispense:  20 capsule    Refill:  0    Medications Discontinued During This Encounter  Medication Reason   azithromycin (ZITHROMAX) 250 MG tablet    promethazine-dextromethorphan (PROMETHAZINE-DM) 6.25-15 MG/5ML syrup     Follow-up: No follow-ups on file.   Crecencio Mc, MD

## 2021-02-17 NOTE — Patient Instructions (Signed)
I am treating you for bronchitis.    YOUR CHEST X RAY LOOKS CLEAR TO ME ( I'm still waiting for the radiologist report).  Start the prednisone taper today,  all tablets each day once a day: 6 tablets all at once on Day 1,  Then taper by 1 tablet daily until gone  Tussionex cough syrup for nighttime cough.  SEDATING AND POSSIBLY CONSTIPATING BUT YOU WILL SLEEP  Use the benzonatate cough capsules during the day   Use throat lozenges to keep throat lubricated   Use the albuterol MDI as needed for chest tightness and wheezing.:   2 Puffs every 6 hours max dose .  It may make your heart race a little.

## 2021-02-20 NOTE — Assessment & Plan Note (Addendum)
Events of the last 2 weeks reviewed.  Chest x ray normal.  He has had resolution of purulent rhinitis/sputum after taking azithromycin .  Will treat with prednisone and cough suppressant.

## 2021-02-26 ENCOUNTER — Other Ambulatory Visit: Payer: Self-pay | Admitting: Family Medicine

## 2021-04-08 IMAGING — CR DG CHEST 2V
1 series · 2 of 2 positions shown · non-contrast
Comparison: December 27, 2018

CLINICAL DATA: History of testicular carcinoma

EXAM:
CHEST - 2 VIEW

[Series 1: dg chest 2 view · 0.14mm/px · 2 of 2 slices shown]
[im 1/2]
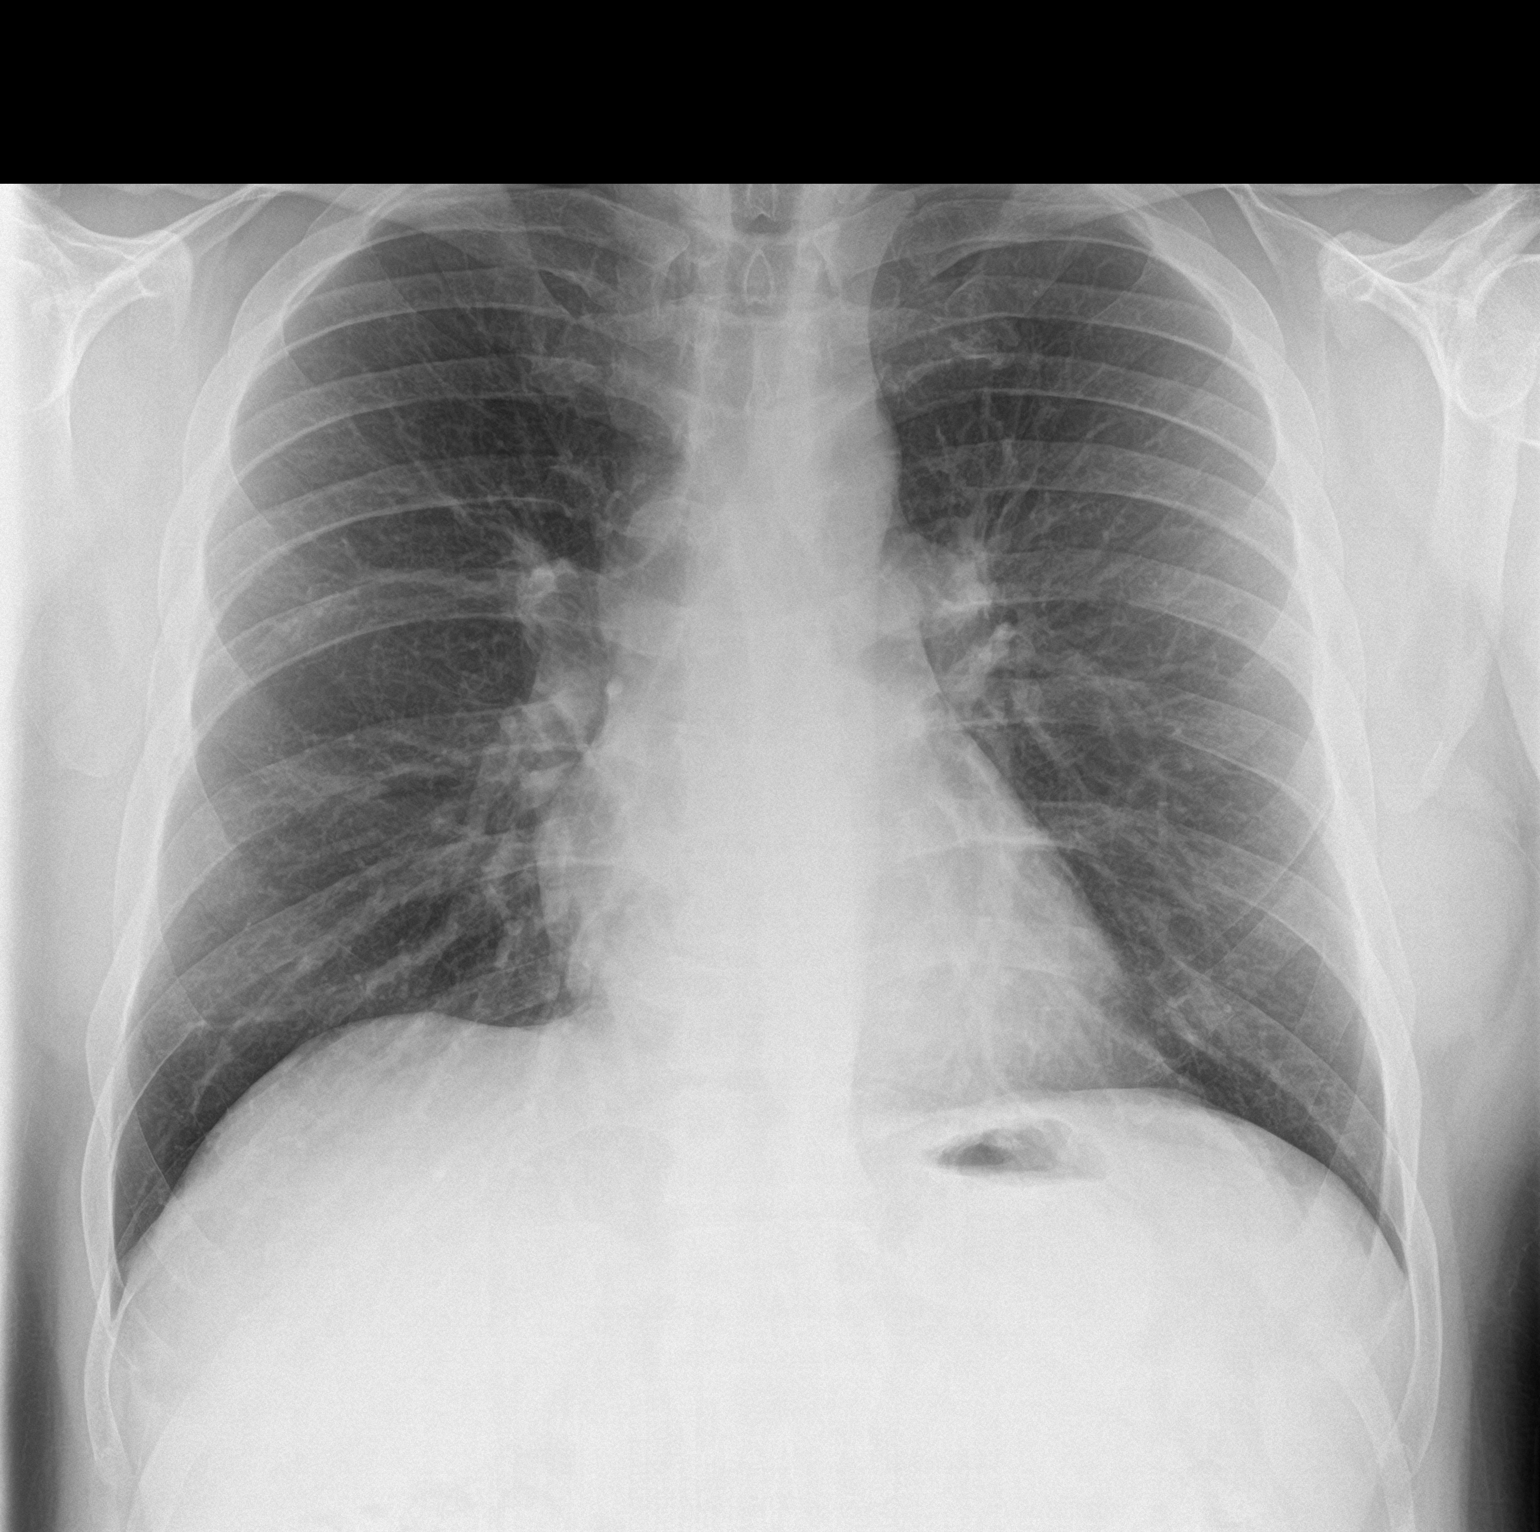
[im 2/2]
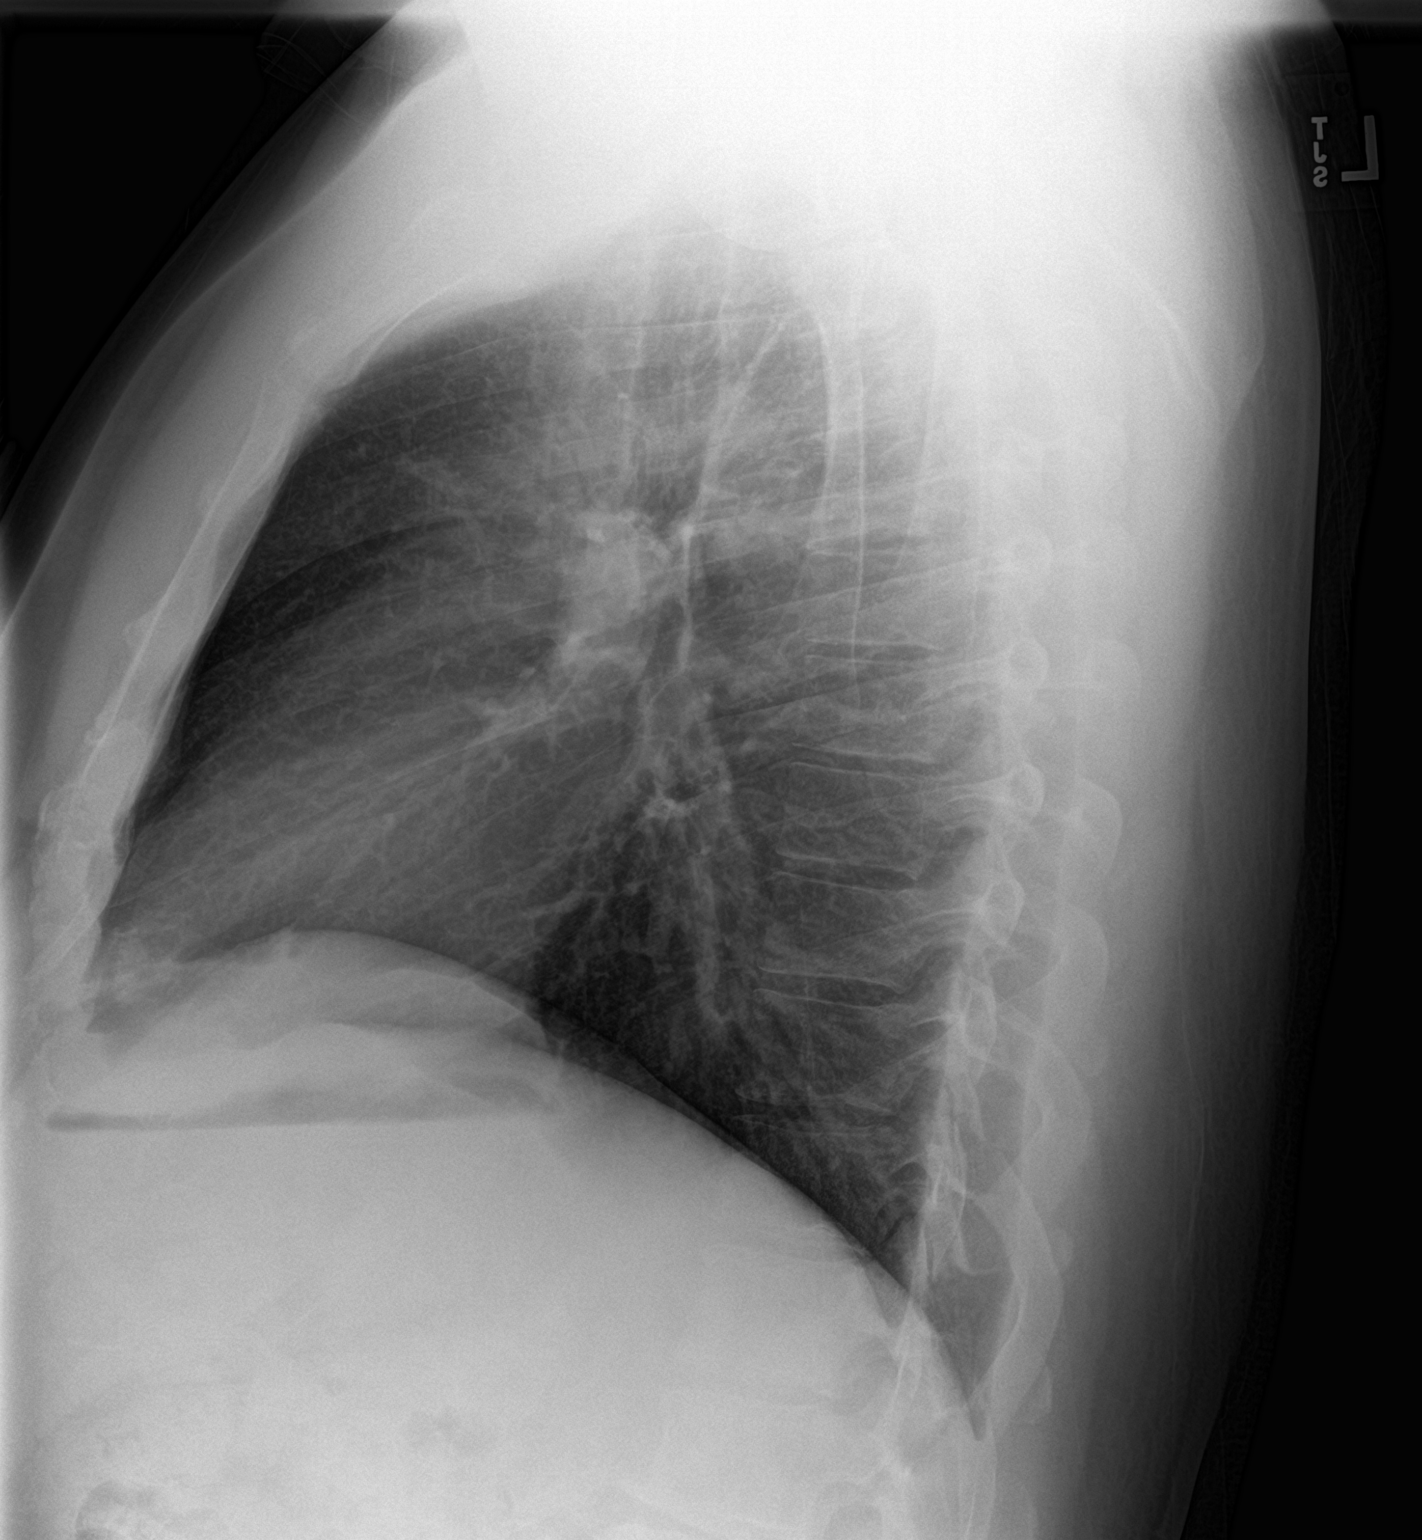

[2 of 2 positions shown; findings below may reference images not displayed]

FINDINGS: Lungs are clear. Heart size and pulmonary vascularity are normal. No
adenopathy. No bone lesions.
IMPRESSION: No abnormality noted.  No neoplastic focus evident.

## 2022-02-03 IMAGING — CR DG CHEST 2V
1 series · 2 of 2 positions shown · non-contrast
Comparison: 07/02/2019

CLINICAL DATA: Testicular cancer diagnosed November 2016.

EXAM:
CHEST - 2 VIEW

[Series 1: dg chest 2 view · 0.14mm/px · 2 of 2 slices shown]
[im 1/2]
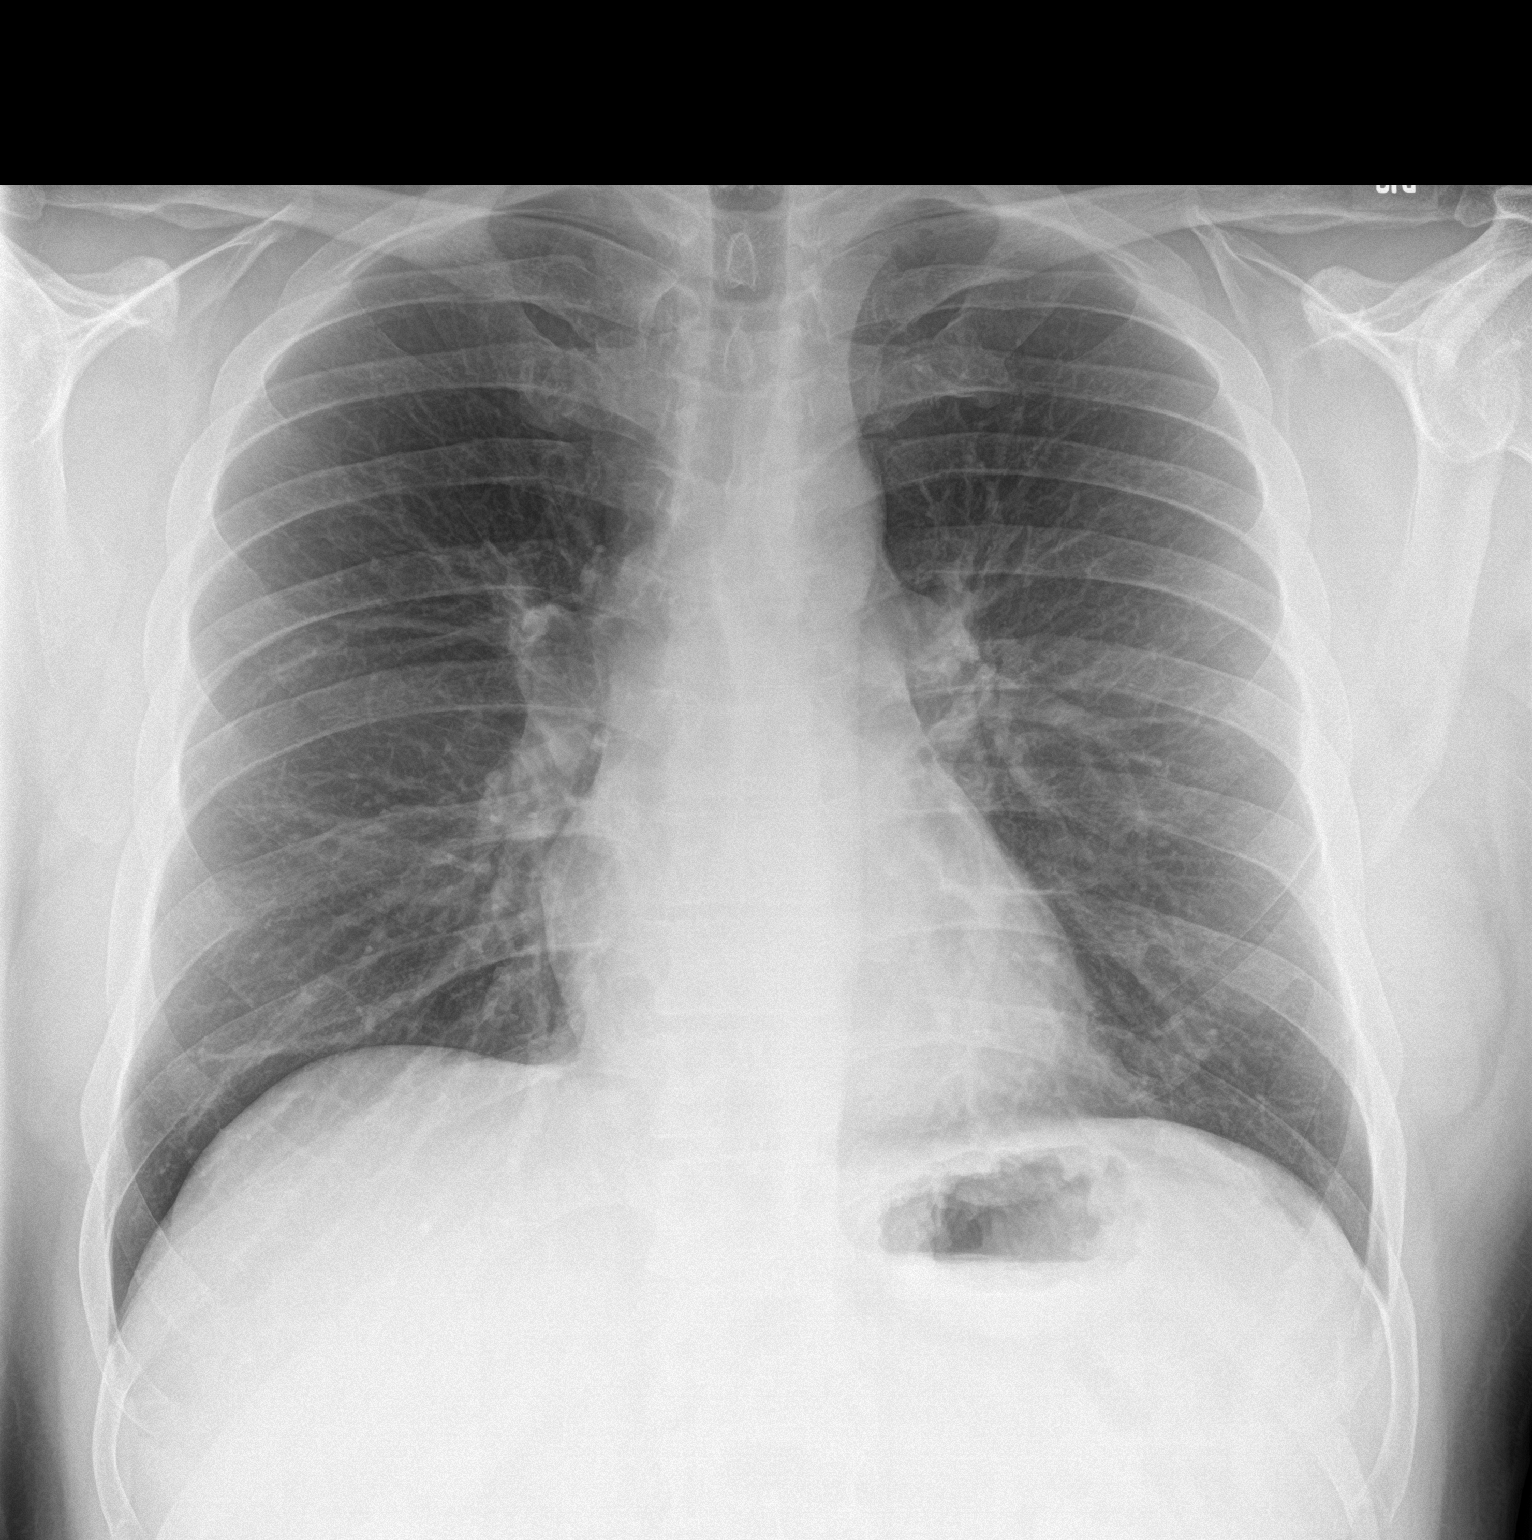
[im 2/2]
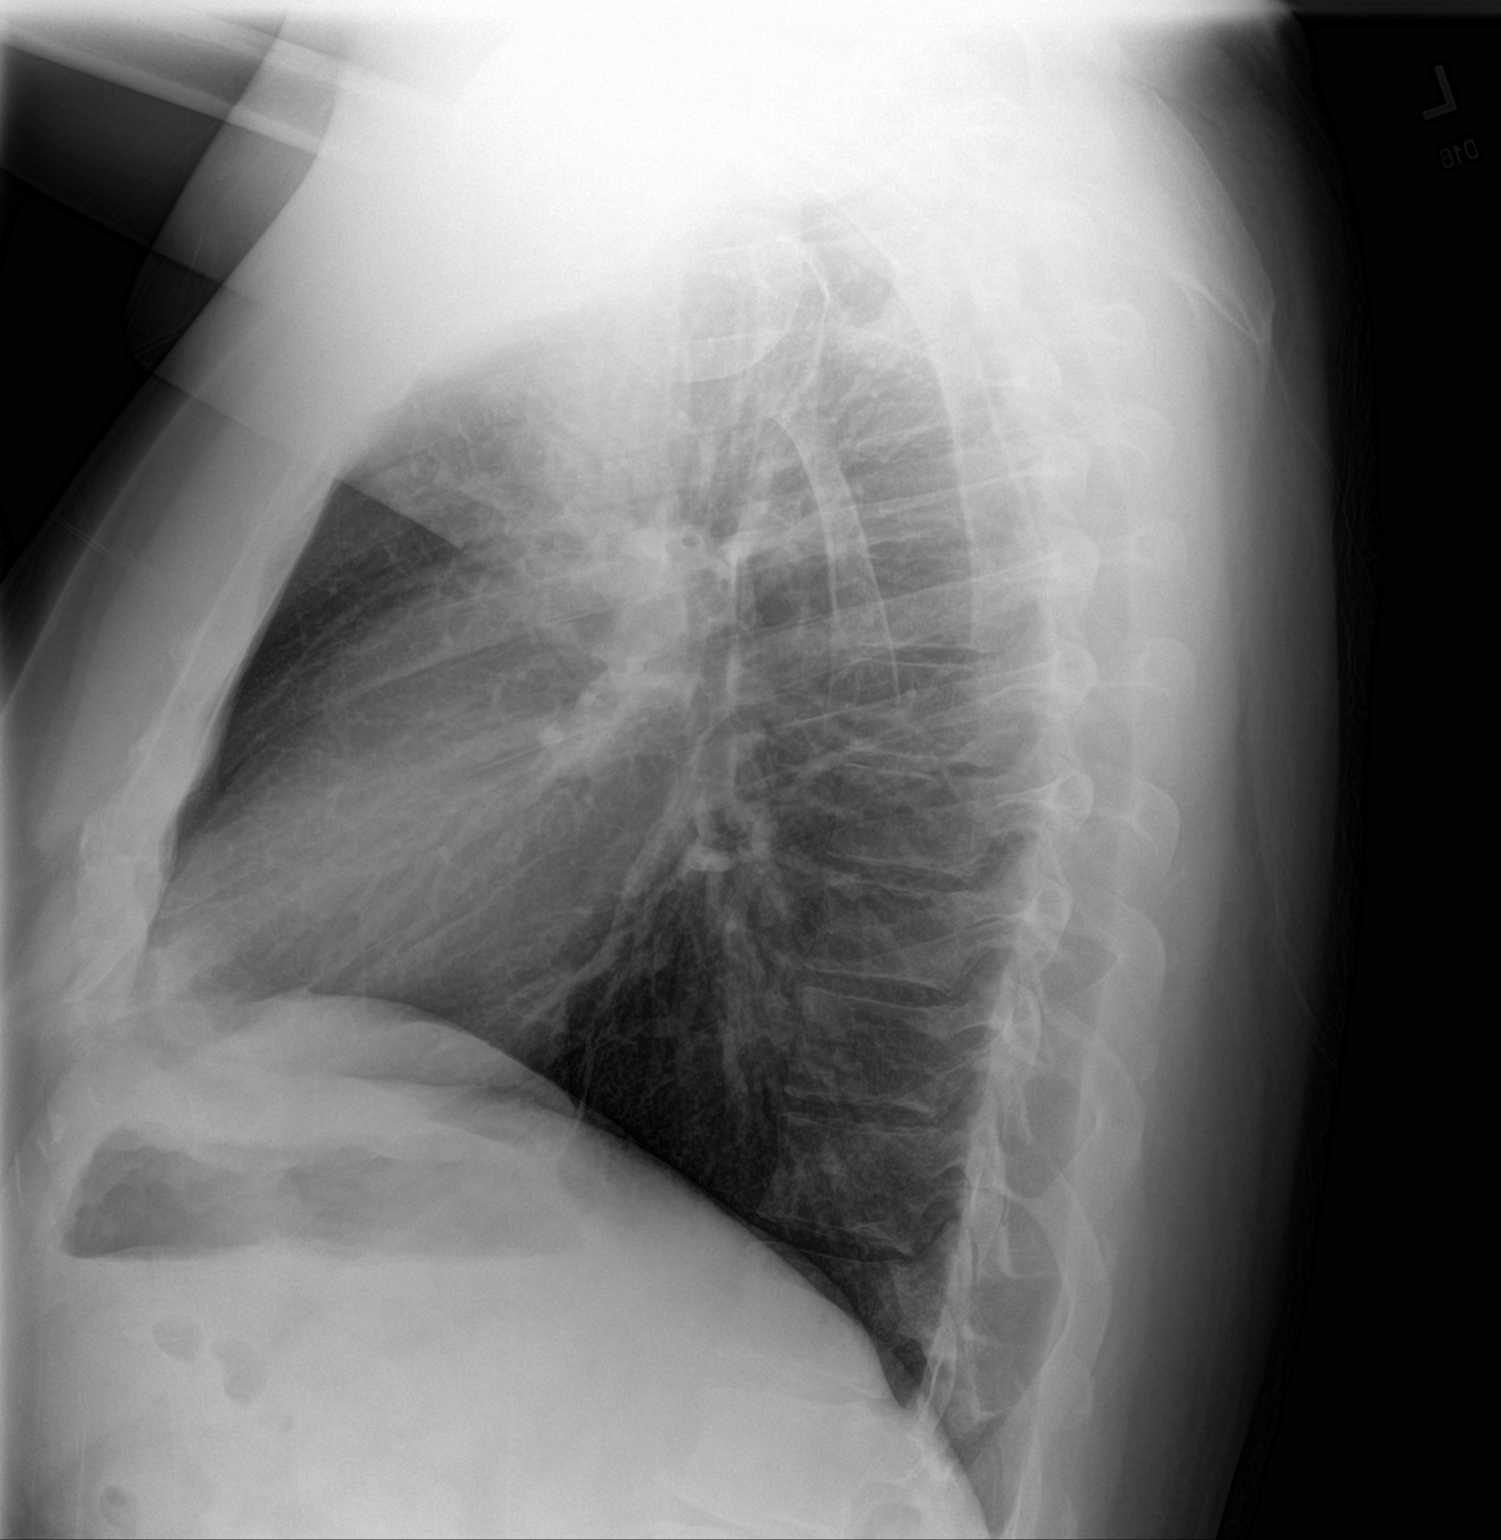

[2 of 2 positions shown; findings below may reference images not displayed]

FINDINGS: Lungs are adequately inflated and otherwise clear. Cardiomediastinal
silhouette, bones and soft tissues are normal.
IMPRESSION: No active cardiopulmonary disease.

## 2022-07-13 ENCOUNTER — Ambulatory Visit
Admission: RE | Admit: 2022-07-13 | Discharge: 2022-07-13 | Disposition: A | Discharge status: Home / Self Care | Payer: 59 | Source: Ambulatory Visit | Attending: Urology | Admitting: Urology

## 2022-07-13 ENCOUNTER — Other Ambulatory Visit: Payer: Self-pay | Admitting: Urology

## 2022-07-13 ENCOUNTER — Ambulatory Visit
Admission: RE | Admit: 2022-07-13 | Discharge: 2022-07-13 | Disposition: A | Discharge status: Home / Self Care | Payer: 59 | Attending: Urology | Admitting: Urology

## 2022-07-13 DIAGNOSIS — C6210 Malignant neoplasm of unspecified descended testis: Secondary | ICD-10-CM

## 2022-11-25 IMAGING — CR DG CHEST 2V
2 series · 2 of 2 positions shown · non-contrast
Comparison: 04/28/2020.

CLINICAL DATA: Cough.

EXAM:
CHEST - 2 VIEW

[chest pa]
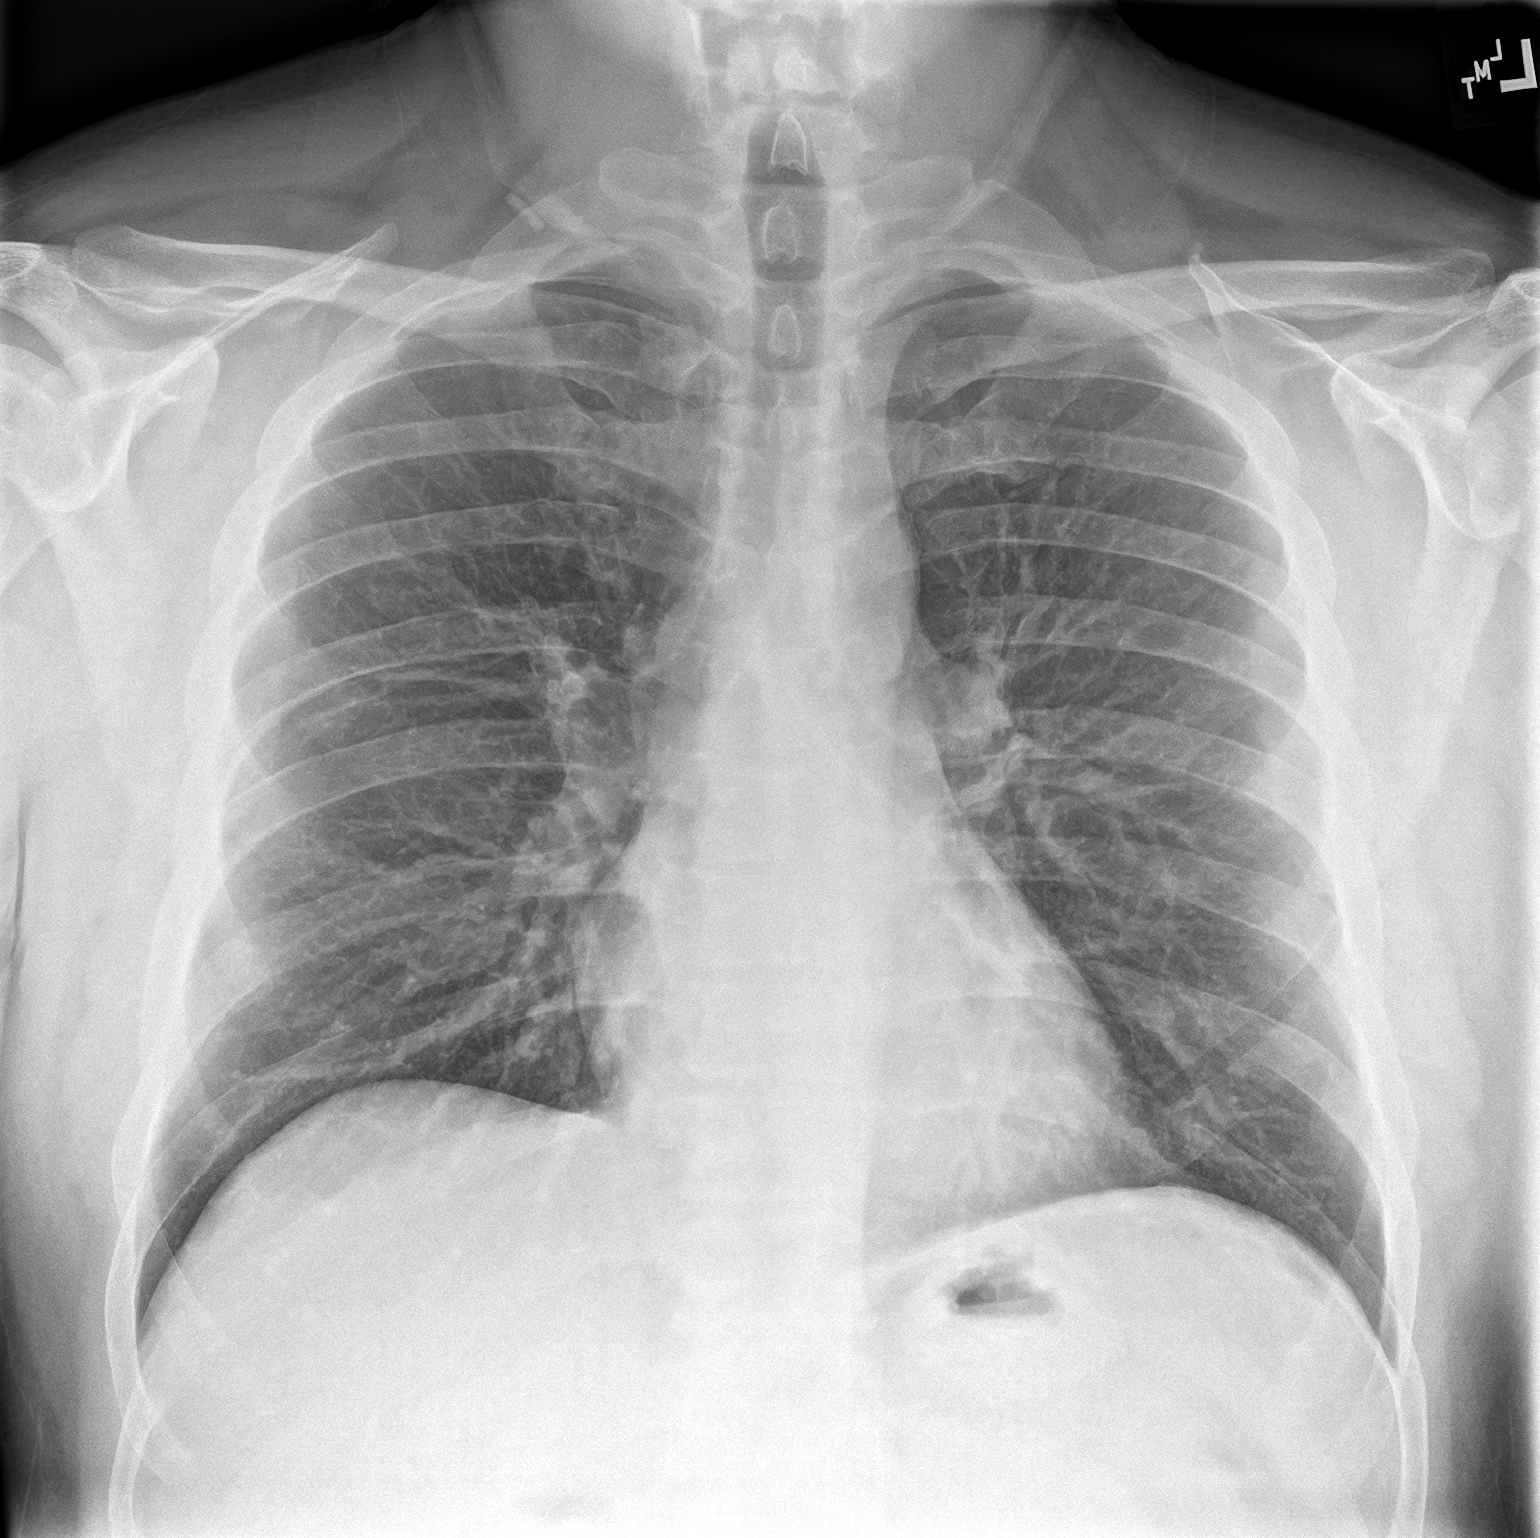

[chest lat]
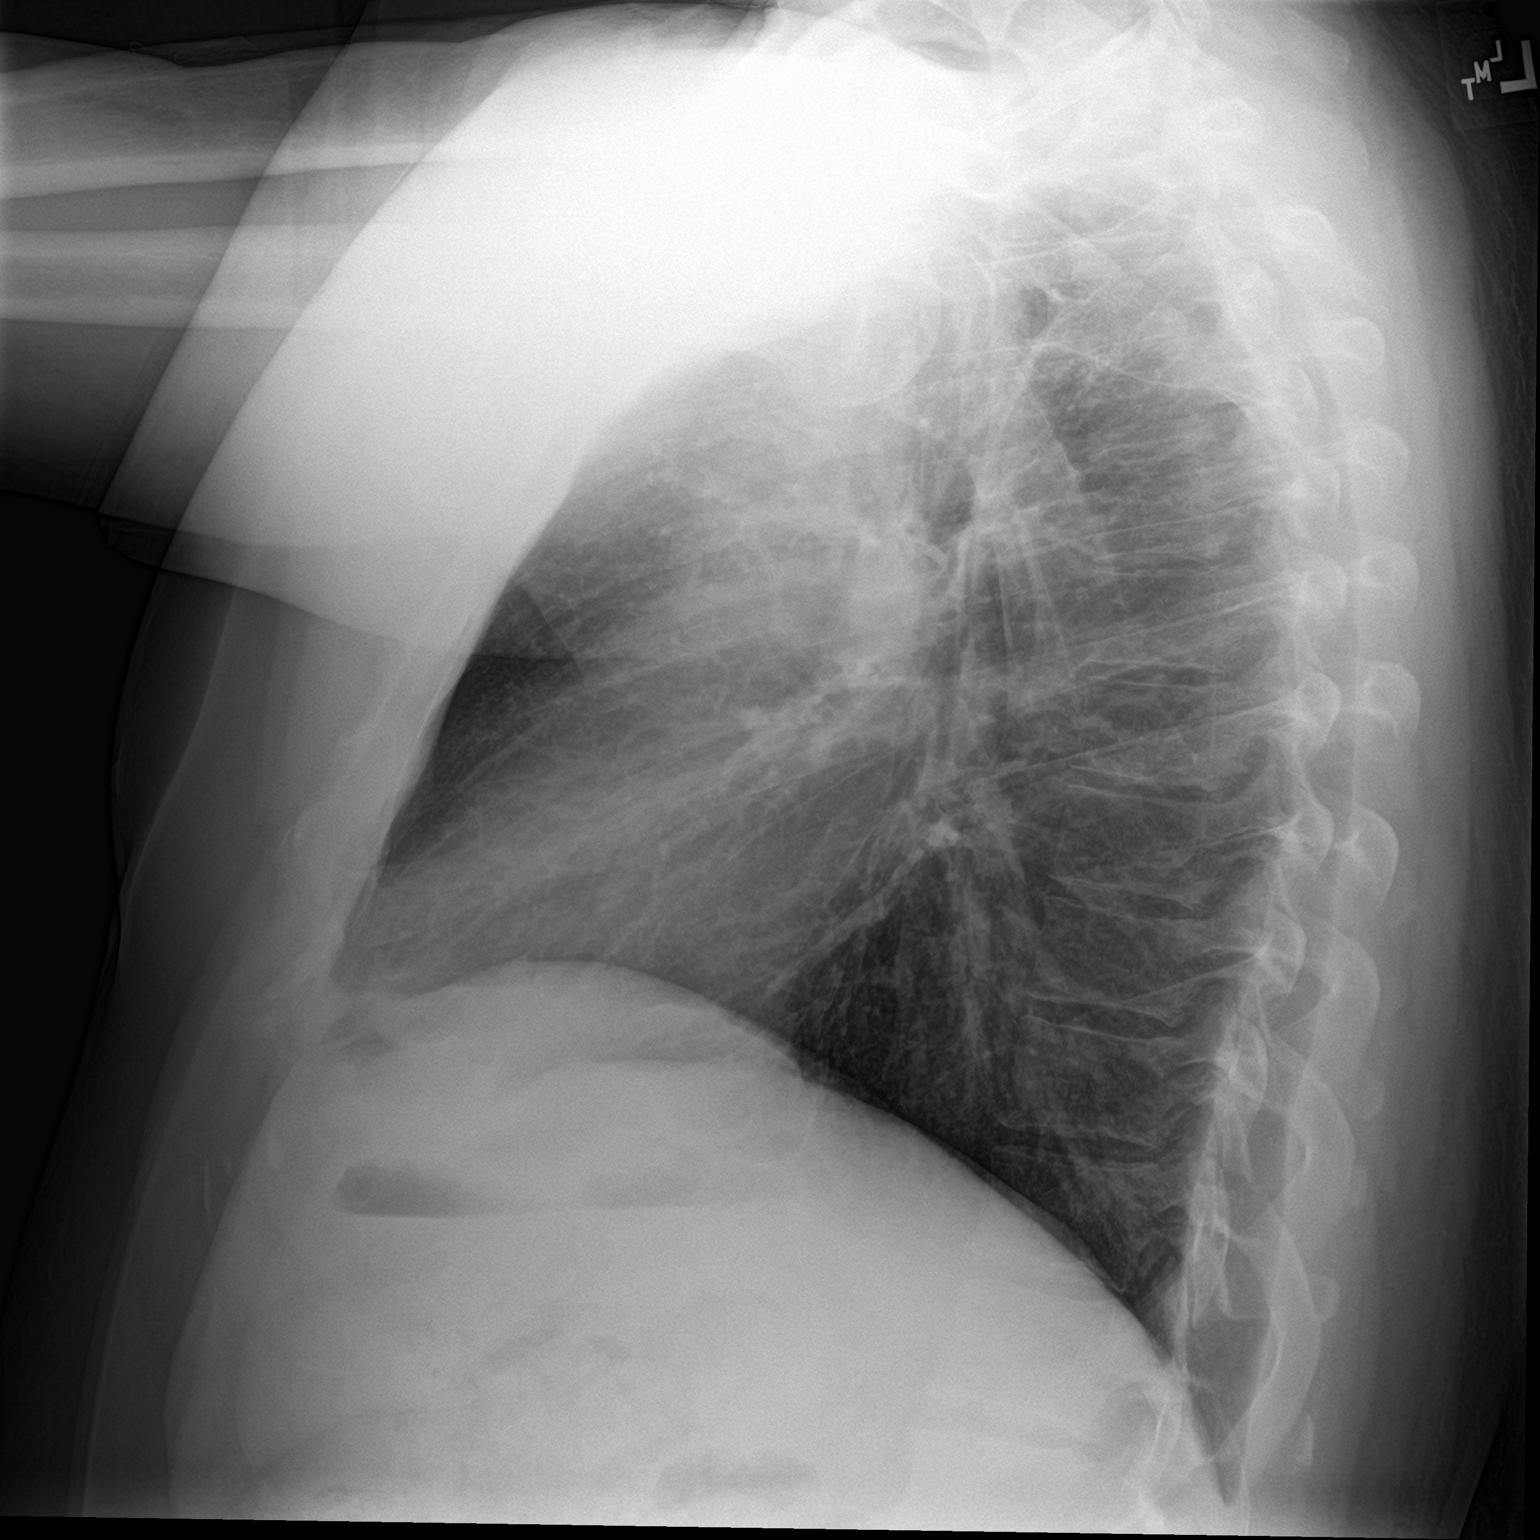

[2 of 2 positions shown; findings below may reference images not displayed]

FINDINGS: The heart size and mediastinal contours are within normal limits.
Both lungs are clear. No visible pleural effusions or pneumothorax.
No acute osseous abnormality.
IMPRESSION: No active cardiopulmonary disease.

## 2023-05-15 ENCOUNTER — Encounter: Payer: Self-pay | Admitting: Family Medicine

## 2023-05-15 ENCOUNTER — Ambulatory Visit
Admission: RE | Admit: 2023-05-15 | Discharge: 2023-05-15 | Disposition: A | Discharge status: Home / Self Care | Payer: 59 | Attending: Family Medicine | Admitting: Family Medicine

## 2023-05-15 ENCOUNTER — Ambulatory Visit (INDEPENDENT_AMBULATORY_CARE_PROVIDER_SITE_OTHER): Payer: 59 | Admitting: Family Medicine

## 2023-05-15 ENCOUNTER — Ambulatory Visit
Admission: RE | Admit: 2023-05-15 | Discharge: 2023-05-15 | Disposition: A | Discharge status: Home / Self Care | Payer: 59 | Source: Ambulatory Visit | Attending: Family Medicine | Admitting: Family Medicine

## 2023-05-15 VITALS — BP 138/78 | HR 79 | Temp 98.2°F | Ht 72.0 in | Wt 264.0 lb

## 2023-05-15 DIAGNOSIS — R051 Acute cough: Secondary | ICD-10-CM | POA: Insufficient documentation

## 2023-05-15 MED ORDER — DOXYCYCLINE HYCLATE 100 MG PO TABS: 100.0000 mg | ORAL_TABLET | Freq: Two times a day (BID) | ORAL | 0 refills | Status: DC

## 2023-05-15 NOTE — Progress Notes (Signed)
Marikay Alar, MD Phone: [phone]  Rithik Odea is a 42 y.o. male who presents today for same-day visit.  Cough: Patient notes onset of symptoms a month ago.  Started with fever, chills, and bodyaches for 2 days.  Subsequently developed cough and chest congestion/discomfort.  The cough and chest symptoms have continued with no improvement.  Nonproductive cough.  No fevers.  He does have fatigue though he has been working on disaster response and is only getting 3 to 4 hours of sleep nightly.  Feels as though he cannot get a full breath in.  No exertion recently.  Social History   Tobacco Use  Smoking Status Never  Smokeless Tobacco Former   Types: Chew   Quit date: 11/21/2001    Current Outpatient Medications on File Prior to Visit  Medication Sig Dispense Refill   albuterol (VENTOLIN HFA) 108 (90 Base) MCG/ACT inhaler Inhale 2 puffs into the lungs every 6 (six) hours as needed for wheezing or shortness of breath. 3.7 g 11   AMBULATORY NON FORMULARY MEDICATION Medication Name: Nitroglycerin 0.125% gel. Apply pea size amount to the rectum three times a day for 6-8 weeks 30 g 1   benzonatate (TESSALON) 100 MG capsule Take 2 capsules (200 mg total) by mouth 3 (three) times daily as needed for cough. 20 capsule 0   betamethasone dipropionate 0.05 % cream Apply topically 2 (two) times daily as needed.     chlorpheniramine-HYDROcodone (TUSSIONEX PENNKINETIC ER) 10-8 MG/5ML SUER Take 5 mLs by mouth at bedtime as needed. 140 mL 0   ibuprofen (ADVIL,MOTRIN) 200 MG tablet Take by mouth as needed.      ipratropium (ATROVENT) 0.06 % nasal spray Place 2 sprays into both nostrils 4 (four) times daily as needed for rhinitis. 15 mL 0   Multiple Vitamins-Minerals (MENS MULTIVITAMIN PLUS PO) Take by mouth daily.     predniSONE (DELTASONE) 10 MG tablet 6 tablets on Day 1 , then reduce by 1 tablet daily until gone 21 tablet 0   valACYclovir (VALTREX) 1000 MG tablet Take 2 tablets (2,000 mg total) by  mouth 2 (two) times daily. Take for one day at first sign of symptoms. (Patient taking differently: Take 2,000 mg by mouth 2 (two) times daily as needed. Take for one day at first sign of symptoms.) 20 tablet 0   No current facility-administered medications on file prior to visit.     ROS see history of present illness  Objective  Physical Exam Vitals:   05/15/23 1115  BP: 138/78  Pulse: 79  Temp: 98.2 F (36.8 C)  SpO2: 98%    BP Readings from Last 3 Encounters:  05/15/23 138/78  02/17/21 (!) 128/92  02/04/21 131/88   Wt Readings from Last 3 Encounters:  05/15/23 264 lb (119.7 kg)  02/17/21 254 lb 6.4 oz (115.4 kg)  02/11/21 250 lb (113.4 kg)    Physical Exam Constitutional:      General: He is not in acute distress.    Appearance: He is not diaphoretic.  Cardiovascular:     Rate and Rhythm: Normal rate and regular rhythm.     Heart sounds: Normal heart sounds.  Pulmonary:     Effort: Pulmonary effort is normal. No respiratory distress.     Breath sounds: No wheezing.     Comments: Patient had some coarse breath sounds in his bilateral upper lobes though this cleared with cough Skin:    General: Skin is warm and dry.  Neurological:     Mental  Status: He is alert.      Assessment/Plan: Please see individual problem list.  Acute cough Assessment & Plan: Suspect bronchitis given his description of symptoms.  It is possible this started out with COVID or some other viral illness.  Given persistence at this time we will start on doxycycline 100 mg twice daily.  Discussed risk of skin sensitivity to the sun and diarrhea with this medication.  Will get a chest x-ray today to evaluate for possible underlying pneumonia.  Discussed if he had pneumonia we would have to add a second antibiotic.  Advised to seek medical attention if he develops worsening breathing issues, cough productive of blood, or fevers.  Orders: -     Doxycycline Hyclate; Take 1 tablet (100 mg  total) by mouth 2 (two) times daily.  Dispense: 14 tablet; Refill: 0 -     DG Chest 2 View; Future     Return in about 1 month (around 06/15/2023) for CPE.   Marikay Alar, MD [facility]

## 2023-05-15 NOTE — Assessment & Plan Note (Signed)
Suspect bronchitis given his description of symptoms.  It is possible this started out with COVID or some other viral illness.  Given persistence at this time we will start on doxycycline 100 mg twice daily.  Discussed risk of skin sensitivity to the sun and diarrhea with this medication.  Will get a chest x-ray today to evaluate for possible underlying pneumonia.  Discussed if he had pneumonia we would have to add a second antibiotic.  Advised to seek medical attention if he develops worsening breathing issues, cough productive of blood, or fevers.

## 2023-05-22 ENCOUNTER — Telehealth: Payer: Self-pay

## 2023-05-22 NOTE — Telephone Encounter (Signed)
Left detailed msg for pt

## 2023-05-22 NOTE — Telephone Encounter (Signed)
-----   Message from Marikay Alar sent at 05/22/2023  8:18 AM EDT ----- Please relay the MyChart result message to the patient.  Thanks.

## 2023-05-22 NOTE — Telephone Encounter (Signed)
Left message to call the office back regarding the xray result.

## 2023-05-24 ENCOUNTER — Encounter: Payer: Self-pay | Admitting: Nurse Practitioner

## 2023-05-24 ENCOUNTER — Ambulatory Visit (INDEPENDENT_AMBULATORY_CARE_PROVIDER_SITE_OTHER): Payer: 59 | Admitting: Nurse Practitioner

## 2023-05-24 VITALS — BP 120/90 | HR 66 | Temp 98.2°F | Ht 72.0 in | Wt 263.4 lb

## 2023-05-24 DIAGNOSIS — R051 Acute cough: Secondary | ICD-10-CM

## 2023-05-24 MED ORDER — TRIAMCINOLONE ACETONIDE 40 MG/ML IJ SUSP
40.0000 mg | Freq: Once | INTRAMUSCULAR | Status: DC
Start: 2023-05-24 — End: 2023-05-24

## 2023-05-24 MED ORDER — TRIAMCINOLONE ACETONIDE 40 MG/ML IJ SUSP
80.0000 mg | Freq: Once | INTRAMUSCULAR | Status: DC
Start: 2023-05-24 — End: 2023-05-24

## 2023-05-24 MED ORDER — TRIAMCINOLONE ACETONIDE 40 MG/ML IJ SUSP
40.0000 mg | Freq: Once | INTRAMUSCULAR | Status: AC
Start: 2023-05-24 — End: 2023-05-24
  Administered 2023-05-24: 40 mg via INTRAMUSCULAR

## 2023-05-24 NOTE — Progress Notes (Signed)
Provider placed orders for pt to receive altogether 80 mg of kenalog. Pt was identified through two identifiers, and pt tolerated injection well in the right ventrogluteal

## 2023-05-24 NOTE — Progress Notes (Signed)
Bethanie Dicker, NP-C Phone: [phone]  Martin Dunn is a 42 y.o. male who presents today for cough.   Patient reports persistent cough for the last 5 weeks. It has recently started to become productive. He does note mild chest tightness. He has some drainage first thing in the morning that resolves throughout the day. Denies sore throat. Denies congestion. Denies shortness of breath. Denies fevers. He does have increased fatigue. He was evaluated on 05/15/2023 for his cough and treated with Doxycycline. He completed the entire course of medication. He also had a negative chest x-ray that day.    Social History   Tobacco Use  Smoking Status Never  Smokeless Tobacco Former   Types: Chew   Quit date: 11/21/2001    Current Outpatient Medications on File Prior to Visit  Medication Sig Dispense Refill   albuterol (VENTOLIN HFA) 108 (90 Base) MCG/ACT inhaler Inhale 2 puffs into the lungs every 6 (six) hours as needed for wheezing or shortness of breath. 3.7 g 11   AMBULATORY NON FORMULARY MEDICATION Medication Name: Nitroglycerin 0.125% gel. Apply pea size amount to the rectum three times a day for 6-8 weeks 30 g 1   betamethasone dipropionate 0.05 % cream Apply topically 2 (two) times daily as needed.     ibuprofen (ADVIL,MOTRIN) 200 MG tablet Take by mouth as needed.      ipratropium (ATROVENT) 0.06 % nasal spray Place 2 sprays into both nostrils 4 (four) times daily as needed for rhinitis. 15 mL 0   Multiple Vitamins-Minerals (MENS MULTIVITAMIN PLUS PO) Take by mouth daily.     valACYclovir (VALTREX) 1000 MG tablet Take 2 tablets (2,000 mg total) by mouth 2 (two) times daily. Take for one day at first sign of symptoms. (Patient taking differently: Take 2,000 mg by mouth 2 (two) times daily as needed. Take for one day at first sign of symptoms.) 20 tablet 0   No current facility-administered medications on file prior to visit.    ROS see history of present illness  Objective  Physical  Exam Vitals:   05/24/23 1101 05/24/23 1140  BP: (!) 128/96 (!) 120/90  Pulse: 66   Temp: 98.2 F (36.8 C)   SpO2: 99%     BP Readings from Last 3 Encounters:  05/24/23 (!) 120/90  05/15/23 138/78  02/17/21 (!) 128/92   Wt Readings from Last 3 Encounters:  05/24/23 263 lb 6.4 oz (119.5 kg)  05/15/23 264 lb (119.7 kg)  02/17/21 254 lb 6.4 oz (115.4 kg)    Physical Exam Constitutional:      General: He is not in acute distress.    Appearance: Normal appearance.  HENT:     Head: Normocephalic.     Right Ear: Tympanic membrane normal.     Left Ear: Tympanic membrane normal.     Nose: Nose normal.     Mouth/Throat:     Mouth: Mucous membranes are moist.     Pharynx: Oropharynx is clear.  Eyes:     Conjunctiva/sclera: Conjunctivae normal.     Pupils: Pupils are equal, round, and reactive to light.  Cardiovascular:     Rate and Rhythm: Normal rate and regular rhythm.     Heart sounds: Normal heart sounds.  Pulmonary:     Effort: Pulmonary effort is normal.     Breath sounds: Normal breath sounds.  Lymphadenopathy:     Cervical: No cervical adenopathy.  Skin:    General: Skin is warm and dry.  Neurological:  General: No focal deficit present.     Mental Status: He is alert.  Psychiatric:        Mood and Affect: Mood normal.        Behavior: Behavior normal.    Assessment/Plan: Please see individual problem list.  Acute cough Assessment & Plan: Persistent cough. Lungs clear on exam. Completed course of Doxy and had negative chest x-ray. Will treat with 80 mg Kenalog IM. Counseled on common side effects. Strict return precautions given to patient.   Orders: -     Triamcinolone Acetonide -     Triamcinolone Acetonide    Return if symptoms worsen or fail to improve.   Bethanie Dicker, NP-C [facility] [employer]

## 2023-06-05 NOTE — Assessment & Plan Note (Signed)
Persistent cough. Lungs clear on exam. Completed course of Doxy and had negative chest x-ray. Will treat with 80 mg Kenalog IM. Counseled on common side effects. Strict return precautions given to patient.

## 2023-06-19 ENCOUNTER — Encounter: Payer: Self-pay | Admitting: Family Medicine

## 2023-06-19 ENCOUNTER — Ambulatory Visit (INDEPENDENT_AMBULATORY_CARE_PROVIDER_SITE_OTHER): Payer: 59 | Admitting: Family Medicine

## 2023-06-19 VITALS — BP 118/76 | HR 67 | Temp 97.9°F | Ht 72.0 in | Wt 264.0 lb

## 2023-06-19 DIAGNOSIS — Z13220 Encounter for screening for lipoid disorders: Secondary | ICD-10-CM | POA: Diagnosis not present

## 2023-06-19 DIAGNOSIS — Z130 Encounter for screening for diseases of the blood and blood-forming organs and certain disorders involving the immune mechanism: Secondary | ICD-10-CM

## 2023-06-19 DIAGNOSIS — E669 Obesity, unspecified: Secondary | ICD-10-CM | POA: Insufficient documentation

## 2023-06-19 DIAGNOSIS — Z0000 Encounter for general adult medical examination without abnormal findings: Secondary | ICD-10-CM

## 2023-06-19 DIAGNOSIS — Z23 Encounter for immunization: Secondary | ICD-10-CM

## 2023-06-19 LAB — COMPREHENSIVE METABOLIC PANEL
ALT: 31 U/L (ref 0–53)
AST: 28 U/L (ref 0–37)
Albumin: 4.5 g/dL (ref 3.5–5.2)
Alkaline Phosphatase: 69 U/L (ref 39–117)
BUN: 24 mg/dL — ABNORMAL HIGH (ref 6–23)
CO2: 28 meq/L (ref 19–32)
Calcium: 9.3 mg/dL (ref 8.4–10.5)
Chloride: 104 meq/L (ref 96–112)
Creatinine, Ser: 0.89 mg/dL (ref 0.40–1.50)
GFR: 104.92 mL/min (ref >60.00)
Glucose, Bld: 100 mg/dL — ABNORMAL HIGH (ref 70–99)
Potassium: 4.1 meq/L (ref 3.5–5.1)
Sodium: 141 meq/L (ref 135–145)
Total Bilirubin: 0.6 mg/dL (ref 0.2–1.2)
Total Protein: 6.7 g/dL (ref 6.0–8.3)

## 2023-06-19 LAB — CBC
HCT: 41.7 % (ref 39.0–52.0)
Hemoglobin: 14.2 g/dL (ref 13.0–17.0)
MCHC: 34.1 g/dL (ref 30.0–36.0)
MCV: 88.7 fL (ref 78.0–100.0)
Platelets: 220 10*3/uL (ref 150.0–400.0)
RBC: 4.7 Mil/uL (ref 4.22–5.81)
RDW: 13.8 % (ref 11.5–15.5)
WBC: 5.5 10*3/uL (ref 4.0–10.5)

## 2023-06-19 LAB — LIPID PANEL
Cholesterol: 187 mg/dL (ref 0–200)
HDL: 44.7 mg/dL (ref >39.00)
LDL Cholesterol: 131 mg/dL — ABNORMAL HIGH (ref 0–99)
NonHDL: 142.28
Total CHOL/HDL Ratio: 4
Triglycerides: 57 mg/dL (ref 0.0–149.0)
VLDL: 11.4 mg/dL (ref 0.0–40.0)

## 2023-06-19 LAB — HEMOGLOBIN A1C: Hgb A1c MFr Bld: 5.7 % (ref 4.6–6.5)

## 2023-06-19 NOTE — Progress Notes (Signed)
Marikay Alar, MD Phone: [phone]  Martin Dunn is a 42 y.o. male who presents today for CPE.  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   Active Ambulatory Problems    Diagnosis Date Noted   GERD (gastroesophageal reflux disease) 09/20/2018   HSV infection 09/20/2018   Spermatocytic seminoma (HCC) 09/20/2018   History of irregular heartbeat 09/20/2018   Routine general medical examination at a health care facility 11/13/2019   Rectal bleeding 11/13/2019   Neuropathy 11/13/2019   Cough 02/11/2021   Obesity (BMI 35.0-39.9 without comorbidity) 06/19/2023   Resolved Ambulatory Problems    Diagnosis Date Noted   No Resolved Ambulatory Problems   Past Medical History:  Diagnosis Date   Chicken pox    Mass of right testis     Family History  Problem Relation Age of Onset   Arthritis Mother    Asthma Mother    Depression Mother    Miscarriages / [place] Mother    Diabetes Father    Hearing loss Father    Heart attack Father    Hypertension Father    Arthritis Maternal Grandmother    COPD Maternal Grandfather    Heart disease Paternal Grandmother    Stomach cancer Maternal Uncle    Esophageal cancer Paternal Uncle    Colon cancer Neg Hx    Rectal cancer Neg Hx     Social History   Socioeconomic History   Marital status: Married    Spouse name: Not on file   Number of children: Not on  file   Years of education: Not on file   Highest education level: Not on file  Occupational History   Not on file  Tobacco Use   Smoking status: Never   Smokeless tobacco: Former    Types: Chew    Quit date: 11/21/2001  Vaping Use   Vaping status: Never Used  Substance and Sexual Activity   Alcohol use: Yes    Comment: social   Drug use: No   Sexual activity: Yes  Other Topics Concern   Not on file  Social History Narrative   Not on file   Social Determinants of Health   Financial Resource Strain: Patient Declined (06/18/2023)   Overall Financial Resource Strain (CARDIA)    Difficulty of Paying Living Expenses: Patient declined  Food Insecurity: Patient Declined (06/18/2023)   Hunger Vital Sign    Worried About Running Out of Food in the Last Year: Patient declined    Ran Out of Food in the Last Year: Patient declined  Transportation Needs: Unknown (06/18/2023)   PRAPARE - Transportation    Lack of Transportation (Medical): No    Lack of Transportation (Non-Medical): Patient declined  Physical Activity: Insufficiently Active (06/18/2023)   Exercise Vital Sign    Days of Exercise per Week: 3 days    Minutes of Exercise per Session: 30 min  Stress: No Stress Concern Present (06/18/2023)   [employer] of Occupational  Health - Occupational Stress Questionnaire    Feeling of Stress : Not at all  Social Connections: Unknown (06/18/2023)   Social Connection and Isolation Panel [NHANES]    Frequency of Communication with Friends and Family: Patient declined    Frequency of Social Gatherings with Friends and Family: Patient declined    Attends Religious Services: Patient declined    Database administrator or Organizations: Patient declined    Attends Engineer, structural: Not on file    Marital Status: Married  Catering manager Violence: Not on file    ROS  General:  Negative for nexplained weight loss, fever Skin: Negative for new or changing mole, sore  that won't heal HEENT: Negative for trouble hearing, trouble seeing, ringing in ears, mouth sores, hoarseness, change in voice, dysphagia. CV:  Negative for chest pain, dyspnea, edema, palpitations Resp: Negative for cough, dyspnea, hemoptysis GI: Negative for nausea, vomiting, diarrhea, constipation, abdominal pain, melena, hematochezia. GU: Negative for dysuria, incontinence, urinary hesitance, hematuria, vaginal or penile discharge, polyuria, sexual difficulty, lumps in testicle or breasts MSK: Negative for muscle cramps or aches, joint pain or swelling Neuro: Negative for headaches, weakness, numbness, dizziness, passing out/fainting Psych: Negative for depression, anxiety, memory problems  Objective  Physical Exam Vitals:   06/19/23 0814  BP: 118/76  Pulse: 67  Temp: 97.9 F (36.6 C)  SpO2: 98%    BP Readings from Last 3 Encounters:  06/19/23 118/76  05/24/23 (!) 120/90  05/15/23 138/78   Wt Readings from Last 3 Encounters:  06/19/23 264 lb (119.7 kg)  05/24/23 263 lb 6.4 oz (119.5 kg)  05/15/23 264 lb (119.7 kg)    Physical Exam Constitutional:      General: He is not in acute distress.    Appearance: He is not diaphoretic.  HENT:     Head: Normocephalic and atraumatic.  Cardiovascular:     Rate and Rhythm: Normal rate and regular rhythm.     Heart sounds: Normal heart sounds.  Pulmonary:     Effort: Pulmonary effort is normal.     Breath sounds: Normal breath sounds.  Abdominal:     General: Bowel sounds are normal. There is no distension.     Palpations: Abdomen is soft.     Tenderness: There is no abdominal tenderness.  Musculoskeletal:     Right lower leg: No edema.     Left lower leg: No edema.  Lymphadenopathy:     Cervical: No cervical adenopathy.  Skin:    General: Skin is warm and dry.  Neurological:     Mental Status: He is alert.  Psychiatric:        Mood and Affect: Mood normal.      Assessment/Plan:   Routine general medical  examination at a health care facility Assessment & Plan: Physical exam completed.  Encouraged reduction in snack intake and sweet intake.  Discussed increasing exercise though if not able to he needs to in the very least maintain his exercise.  Discussed these things would help with weight loss and prevention of chronic medical issues in the future.  Flu vaccine given today.  He declines further COVID vaccinations.  Encouraged him to see an eye doctor as he approaches 42 years old.  Patient declines hepatitis C screening.  Lab work as outlined.   Encounter for administration of vaccine -     Flu vaccine trivalent PF, 6mos and older(Flulaval,Afluria,Fluarix,Fluzone)  Obesity (BMI 35.0-39.9 without comorbidity) -     Hemoglobin A1c  Lipid screening -     Comprehensive metabolic panel -     Lipid panel  Screening for deficiency anemia -     CBC    Return in about 1 year (around 06/18/2024) for physical/transfer to [facility].   Marikay Alar, MD [facility]

## 2023-06-19 NOTE — Assessment & Plan Note (Signed)
Physical exam completed.  Encouraged reduction in snack intake and sweet intake.  Discussed increasing exercise though if not able to he needs to in the very least maintain his exercise.  Discussed these things would help with weight loss and prevention of chronic medical issues in the future.  Flu vaccine given today.  He declines further COVID vaccinations.  Encouraged him to see an eye doctor as he approaches 42 years old.  Patient declines hepatitis C screening.  Lab work as outlined.

## 2024-06-20 ENCOUNTER — Encounter: Payer: 59 | Admitting: Nurse Practitioner

## 2024-07-01 ENCOUNTER — Telehealth: Payer: Self-pay

## 2024-07-01 NOTE — Telephone Encounter (Signed)
 I am unable to see additional primary care patients.  I expect that will be indefinite.

## 2024-07-01 NOTE — Telephone Encounter (Signed)
 Copied from CRM [id number]. Topic: Appointments - Scheduling Inquiry for Clinic >> Jul 01, 2024  8:49 AM Mercedes MATSU wrote: Reason for CRM: Patients wife Delta Deshmukh called on behalf of the Patient requesting for patient to become a new patient of Dr. Ubaldo. Patient states he really wants to see him and he was hoping he would accept him as a new patient. Patient is also aware that dr copeland is not accepting np and is also scheduled to see NP [facility]. Patients wife asking for call back if dr copeland changes his mind, and can be reached at:  Phone: [phone] Adren Dollins (Wife)

## 2024-09-12 ENCOUNTER — Ambulatory Visit: Admitting: Nurse Practitioner

## 2024-09-12 ENCOUNTER — Encounter: Payer: Self-pay | Admitting: Nurse Practitioner

## 2024-09-12 VITALS — BP 134/98 | HR 60 | Temp 97.9°F | Ht 71.5 in | Wt 256.0 lb

## 2024-09-12 DIAGNOSIS — Z0000 Encounter for general adult medical examination without abnormal findings: Secondary | ICD-10-CM

## 2024-09-12 DIAGNOSIS — Z8619 Personal history of other infectious and parasitic diseases: Secondary | ICD-10-CM | POA: Insufficient documentation

## 2024-09-12 DIAGNOSIS — E7800 Pure hypercholesterolemia, unspecified: Secondary | ICD-10-CM | POA: Insufficient documentation

## 2024-09-12 DIAGNOSIS — E669 Obesity, unspecified: Secondary | ICD-10-CM

## 2024-09-12 DIAGNOSIS — Z23 Encounter for immunization: Secondary | ICD-10-CM

## 2024-09-12 DIAGNOSIS — C6291 Malignant neoplasm of right testis, unspecified whether descended or undescended: Secondary | ICD-10-CM

## 2024-09-12 DIAGNOSIS — R030 Elevated blood-pressure reading, without diagnosis of hypertension: Secondary | ICD-10-CM | POA: Insufficient documentation

## 2024-09-12 DIAGNOSIS — R7303 Prediabetes: Secondary | ICD-10-CM | POA: Insufficient documentation

## 2024-09-12 LAB — LIPID PANEL
Cholesterol: 199 mg/dL (ref 28–200)
HDL: 40 mg/dL
LDL Cholesterol: 144 mg/dL — ABNORMAL HIGH (ref 10–99)
NonHDL: 158.57
Total CHOL/HDL Ratio: 5
Triglycerides: 71 mg/dL (ref 10.0–149.0)
VLDL: 14.2 mg/dL (ref 0.0–40.0)

## 2024-09-12 LAB — COMPREHENSIVE METABOLIC PANEL WITH GFR
ALT: 36 U/L (ref 3–53)
AST: 25 U/L (ref 5–37)
Albumin: 4.7 g/dL (ref 3.5–5.2)
Alkaline Phosphatase: 87 U/L (ref 39–117)
BUN: 23 mg/dL (ref 6–23)
CO2: 31 meq/L (ref 19–32)
Calcium: 9.3 mg/dL (ref 8.4–10.5)
Chloride: 101 meq/L (ref 96–112)
Creatinine, Ser: 0.98 mg/dL (ref 0.40–1.50)
GFR: 93.59 mL/min
Glucose, Bld: 99 mg/dL (ref 70–99)
Potassium: 4.2 meq/L (ref 3.5–5.1)
Sodium: 139 meq/L (ref 135–145)
Total Bilirubin: 0.5 mg/dL (ref 0.2–1.2)
Total Protein: 7.1 g/dL (ref 6.0–8.3)

## 2024-09-12 LAB — CBC WITH DIFFERENTIAL/PLATELET
Basophils Absolute: 0 10*3/uL (ref 0.0–0.1)
Basophils Relative: 0.5 % (ref 0.0–3.0)
Eosinophils Absolute: 0.1 10*3/uL (ref 0.0–0.7)
Eosinophils Relative: 1.9 % (ref 0.0–5.0)
HCT: 44.1 % (ref 39.0–52.0)
Hemoglobin: 15 g/dL (ref 13.0–17.0)
Lymphocytes Relative: 20.9 % (ref 12.0–46.0)
Lymphs Abs: 1.2 10*3/uL (ref 0.7–4.0)
MCHC: 33.9 g/dL (ref 30.0–36.0)
MCV: 86.2 fl (ref 78.0–100.0)
Monocytes Absolute: 0.6 10*3/uL (ref 0.1–1.0)
Monocytes Relative: 9.6 % (ref 3.0–12.0)
Neutro Abs: 3.9 10*3/uL (ref 1.4–7.7)
Neutrophils Relative %: 67.1 % (ref 43.0–77.0)
Platelets: 268 10*3/uL (ref 150.0–400.0)
RBC: 5.11 Mil/uL (ref 4.22–5.81)
RDW: 13.1 % (ref 11.5–15.5)
WBC: 5.8 10*3/uL (ref 4.0–10.5)

## 2024-09-12 LAB — TSH: TSH: 1.72 u[IU]/mL (ref 0.35–5.50)

## 2024-09-12 LAB — HEMOGLOBIN A1C: Hgb A1c MFr Bld: 5.8 % (ref 4.6–6.5)

## 2024-09-12 MED ORDER — VALACYCLOVIR HCL 1 G PO TABS: 2000.0000 mg | ORAL_TABLET | Freq: Two times a day (BID) | ORAL | 0 refills | Status: AC

## 2024-09-12 NOTE — Assessment & Plan Note (Signed)
 Elevated blood pressure reading initially and upon recheck.  Patient has 1 other reading in the past that was slightly elevated.  Will have patient follow-up 3 months for blood pressure recheck

## 2024-09-12 NOTE — Assessment & Plan Note (Addendum)
 History of same has been followed and cleared from urology

## 2024-09-12 NOTE — Assessment & Plan Note (Signed)
 Valacyclovir  as needed.  Refill provided

## 2024-09-12 NOTE — Patient Instructions (Signed)
 Nice to see you today  I will be in touch with the the labs once I have them  We did update your flu vaccine today  I want to see you in 3 months to recheck that blood pressure

## 2024-09-12 NOTE — Assessment & Plan Note (Signed)
 History of the same pending lipid panel today

## 2024-09-12 NOTE — Assessment & Plan Note (Signed)
History of the same pending A1c

## 2024-09-12 NOTE — Assessment & Plan Note (Signed)
 Discussed age-appropriate immunizations and screening exams.  Did review patient's personal, surgical, social, family histories.  Patient is up-to-date on all age-appropriate vaccinations he would like.  Patient up-to-date on CRC screening.  Too young for prostate cancer screening.  Patient was given information at discharge about preventative healthcare maintenance with anticipatory guidance.

## 2024-09-12 NOTE — Progress Notes (Signed)
 "  Established Patient Office Visit  Subjective   Patient ID: Martin Dunn, male    DOB: [birth year]  Age: 44 y.o. MRN: [id number]  Chief Complaint  Patient presents with   Transitions Of Care    Flu vaccine     HPI  Cold sore: he uses valacyclovir  about twice a year.  He requests a refill today  for complete physical and follow up of chronic conditions.  Immunizations: -Tetanus: Completed in 2017 -Influenza: up date today  -Shingles: Too Young -Pneumonia: Too young  Diet: Fair diet. He will do 2 meals a day on average. He generally does not eat lunch. He will eat a larger meal in the evening. He will drink coffee in the am and then water  Exercise:  He does it on ocassion 2-3 days a week. He will do walking and some resistance training with weights and body weight  Eye exam: PRN Dental exam: Completes semi-annually    Colonoscopy: Too young, currently average risk Lung Cancer Screening: N/A  PSA: Too young, currently average risk  Sleep: goes to bed around 11-12 and will get up around 5am. Feels rested until about 3 and then feels tried. He does snore.      Review of Systems  Constitutional:  Negative for chills and fever.  Respiratory:  Negative for shortness of breath.   Cardiovascular:  Negative for chest pain and leg swelling.  Gastrointestinal:  Negative for abdominal pain, blood in stool, constipation, diarrhea, nausea and vomiting.       Bm daily   Genitourinary:  Negative for dysuria and hematuria.  Neurological:  Negative for dizziness, tingling and headaches.  Psychiatric/Behavioral:  Negative for hallucinations and suicidal ideas.       Objective:     BP (!) 134/98   Pulse 60   Temp 97.9 F (36.6 C) (Oral)   Ht 5' 11.5 (1.816 m)   Wt 256 lb (116.1 kg)   SpO2 98%   BMI 35.21 kg/m  BP Readings from Last 3 Encounters:  09/12/24 (!) 134/98  06/19/23 118/76  05/24/23 (!) 120/90   Wt Readings from Last 3 Encounters:  09/12/24 256 lb (116.1  kg)  06/19/23 264 lb (119.7 kg)  05/24/23 263 lb 6.4 oz (119.5 kg)   SpO2 Readings from Last 3 Encounters:  09/12/24 98%  06/19/23 98%  05/24/23 99%      Physical Exam Vitals and nursing note reviewed.  Constitutional:      Appearance: Normal appearance.  HENT:     Right Ear: Tympanic membrane, ear canal and external ear normal.     Left Ear: Tympanic membrane, ear canal and external ear normal.     Mouth/Throat:     Mouth: Mucous membranes are moist.     Pharynx: Oropharynx is clear.  Eyes:     Extraocular Movements: Extraocular movements intact.     Pupils: Pupils are equal, round, and reactive to light.  Cardiovascular:     Rate and Rhythm: Normal rate and regular rhythm.     Pulses: Normal pulses.     Heart sounds: Normal heart sounds.  Pulmonary:     Effort: Pulmonary effort is normal.     Breath sounds: Normal breath sounds.  Abdominal:     General: Bowel sounds are normal. There is no distension.     Palpations: There is no mass.     Tenderness: There is no abdominal tenderness.     Hernia: No hernia is present.  Genitourinary:  Comments: Deferred  Musculoskeletal:     Right lower leg: No edema.     Left lower leg: No edema.  Lymphadenopathy:     Cervical: No cervical adenopathy.  Skin:    General: Skin is warm.  Neurological:     General: No focal deficit present.     Mental Status: He is alert.     Deep Tendon Reflexes:     Reflex Scores:      Bicep reflexes are 2+ on the right side and 2+ on the left side.      Patellar reflexes are 2+ on the right side and 2+ on the left side.    Comments: Bilateral upper and lower extremity strength 5/5  Psychiatric:        Mood and Affect: Mood normal.        Behavior: Behavior normal.        Thought Content: Thought content normal.        Judgment: Judgment normal.      No results found for any visits on 09/12/24.    The 10-year ASCVD risk score (Arnett DK, et al., 2019) is: 2%    Assessment & Plan:    Problem List Items Addressed This Visit       Genitourinary   Spermatocytic seminoma (HCC)   History of same has been followed and cleared from urology      Relevant Medications   valACYclovir  (VALTREX ) 1000 MG tablet     Other   Routine general medical examination at a health care facility - Primary   Discussed age-appropriate immunizations and screening exams.  Did review patient's personal, surgical, social, family histories.  Patient is up-to-date on all age-appropriate vaccinations he would like.  Patient up-to-date on CRC screening.  Too young for prostate cancer screening.  Patient was given information at discharge about preventative healthcare maintenance with anticipatory guidance.      Relevant Orders   CBC with Differential/Platelet   Comprehensive metabolic panel with GFR   TSH   Obesity (BMI 35.0-39.9 without comorbidity)   Pending A1c lipid panel TSH.  Continue working hydroxyl modifications inclusive of diet and exercise      Relevant Orders   Hemoglobin A1c   TSH   Lipid panel   H/O cold sores   Valacyclovir  as needed.  Refill provided      Relevant Medications   valACYclovir  (VALTREX ) 1000 MG tablet   Prediabetes   History of the same pending A1c      Relevant Orders   Hemoglobin A1c   Lipid panel   Elevated LDL cholesterol level   History of the same pending lipid panel today      Relevant Orders   Hemoglobin A1c   TSH   Elevated blood pressure reading   Elevated blood pressure reading initially and upon recheck.  Patient has 1 other reading in the past that was slightly elevated.  Will have patient follow-up 3 months for blood pressure recheck      Other Visit Diagnoses       Need for influenza vaccination       Relevant Orders   Flu vaccine trivalent PF, 6mos and older(Flulaval,Afluria,Fluarix,Fluzone) (Completed)       Return in about 3 months (around 12/10/2024) for BP recheck.    Adina Crandall, NP  "

## 2024-09-12 NOTE — Assessment & Plan Note (Signed)
 Pending A1c lipid panel TSH.  Continue working hydroxyl modifications inclusive of diet and exercise

## 2024-12-10 ENCOUNTER — Ambulatory Visit: Admitting: Nurse Practitioner
